# Patient Record
Sex: Male | Born: 1990 | Race: Black or African American | Hispanic: No | Marital: Married | Smoking: Current every day smoker
Health system: Southern US, Community
[De-identification: ages and names within clinical notes are randomized; demographics above are authoritative.]

## PROBLEM LIST (undated history)

## (undated) HISTORY — PX: TONSILLECTOMY: SUR1361

---

## 2011-08-02 ENCOUNTER — Encounter (HOSPITAL_COMMUNITY): Payer: Self-pay | Admitting: Certified Registered Nurse Anesthetist

## 2011-08-02 ENCOUNTER — Emergency Department (HOSPITAL_COMMUNITY): Payer: Self-pay

## 2011-08-02 ENCOUNTER — Encounter (HOSPITAL_COMMUNITY)
Admission: EM | Disposition: A | Discharge status: Home / Self Care | Payer: Self-pay | Source: Home / Self Care | Attending: Orthopedic Surgery

## 2011-08-02 ENCOUNTER — Emergency Department (HOSPITAL_COMMUNITY): Payer: Self-pay | Admitting: Certified Registered Nurse Anesthetist

## 2011-08-02 ENCOUNTER — Encounter (HOSPITAL_COMMUNITY): Payer: Self-pay | Admitting: *Deleted

## 2011-08-02 ENCOUNTER — Inpatient Hospital Stay (HOSPITAL_COMMUNITY)
Admission: EM | Admit: 2011-08-02 | Discharge: 2011-08-05 | DRG: 857 | Disposition: A | Discharge status: Home / Self Care | Payer: Self-pay | Attending: Orthopedic Surgery | Admitting: Orthopedic Surgery

## 2011-08-02 DIAGNOSIS — T8140XA Infection following a procedure, unspecified, initial encounter: Principal | ICD-10-CM | POA: Diagnosis present

## 2011-08-02 DIAGNOSIS — F17200 Nicotine dependence, unspecified, uncomplicated: Secondary | ICD-10-CM | POA: Diagnosis present

## 2011-08-02 DIAGNOSIS — Y849 Medical procedure, unspecified as the cause of abnormal reaction of the patient, or of later complication, without mention of misadventure at the time of the procedure: Secondary | ICD-10-CM | POA: Diagnosis present

## 2011-08-02 DIAGNOSIS — L02519 Cutaneous abscess of unspecified hand: Secondary | ICD-10-CM | POA: Diagnosis present

## 2011-08-02 DIAGNOSIS — L089 Local infection of the skin and subcutaneous tissue, unspecified: Secondary | ICD-10-CM

## 2011-08-02 DIAGNOSIS — W268XXA Contact with other sharp object(s), not elsewhere classified, initial encounter: Secondary | ICD-10-CM | POA: Diagnosis present

## 2011-08-02 DIAGNOSIS — L03019 Cellulitis of unspecified finger: Secondary | ICD-10-CM | POA: Diagnosis present

## 2011-08-02 HISTORY — PX: I & D EXTREMITY: SHX5045

## 2011-08-02 LAB — CBC
Hemoglobin: 14.7 g/dL (ref 13.0–17.0)
MCH: 29.6 pg (ref 26.0–34.0)
MCHC: 34.8 g/dL (ref 30.0–36.0)
Platelets: 177 10*3/uL (ref 150–400)

## 2011-08-02 LAB — POCT I-STAT, CHEM 8
Creatinine, Ser: 1 mg/dL (ref 0.50–1.35)
HCT: 47 % (ref 39.0–52.0)
Hemoglobin: 16 g/dL (ref 13.0–17.0)
Potassium: 4.1 mEq/L (ref 3.5–5.1)
Sodium: 138 mEq/L (ref 135–145)
TCO2: 30 mmol/L (ref 0–100)

## 2011-08-02 SURGERY — IRRIGATION AND DEBRIDEMENT EXTREMITY
Anesthesia: General | Site: Hand | Laterality: Left | Wound class: Contaminated

## 2011-08-02 MED ORDER — ALPRAZOLAM 0.5 MG PO TABS
0.5000 mg | ORAL_TABLET | Freq: Four times a day (QID) | ORAL | Status: DC | PRN
  Administered 2011-08-03: 0.5 mg via ORAL
  Filled 2011-08-02: qty 1

## 2011-08-02 MED ORDER — HYDROMORPHONE HCL PF 1 MG/ML IJ SOLN
0.5000 mg | INTRAMUSCULAR | Status: DC | PRN
  Administered 2011-08-02 – 2011-08-04 (×9): 1 mg via INTRAVENOUS
  Filled 2011-08-02 (×8): qty 1

## 2011-08-02 MED ORDER — LACTATED RINGERS IV SOLN
INTRAVENOUS | Status: DC | PRN
  Administered 2011-08-02 (×2): via INTRAVENOUS

## 2011-08-02 MED ORDER — OXYCODONE HCL 5 MG PO TABS
5.0000 mg | ORAL_TABLET | ORAL | Status: DC | PRN
  Administered 2011-08-02 – 2011-08-05 (×10): 10 mg via ORAL
  Filled 2011-08-02 (×11): qty 2

## 2011-08-02 MED ORDER — MIDAZOLAM HCL 5 MG/5ML IJ SOLN
INTRAMUSCULAR | Status: DC | PRN
  Administered 2011-08-02: 2 mg via INTRAVENOUS

## 2011-08-02 MED ORDER — FENTANYL CITRATE 0.05 MG/ML IJ SOLN
INTRAMUSCULAR | Status: DC | PRN
  Administered 2011-08-02 (×2): 75 ug via INTRAVENOUS

## 2011-08-02 MED ORDER — HYDROMORPHONE HCL PF 1 MG/ML IJ SOLN
1.0000 mg | Freq: Once | INTRAMUSCULAR | Status: AC
  Administered 2011-08-02: 1 mg via INTRAVENOUS
  Filled 2011-08-02: qty 1

## 2011-08-02 MED ORDER — LIDOCAINE HCL (CARDIAC) 20 MG/ML IV SOLN
INTRAVENOUS | Status: DC | PRN
  Administered 2011-08-02: 70 mg via INTRAVENOUS

## 2011-08-02 MED ORDER — VANCOMYCIN HCL IN DEXTROSE 1-5 GM/200ML-% IV SOLN
1000.0000 mg | Freq: Once | INTRAVENOUS | Status: AC
  Administered 2011-08-02: 1000 mg via INTRAVENOUS
  Filled 2011-08-02: qty 200

## 2011-08-02 MED ORDER — ONDANSETRON HCL 4 MG PO TABS: 4.0000 mg | ORAL_TABLET | Freq: Four times a day (QID) | ORAL | Status: DC | PRN

## 2011-08-02 MED ORDER — FAMOTIDINE 20 MG PO TABS
20.0000 mg | ORAL_TABLET | Freq: Two times a day (BID) | ORAL | Status: DC | PRN
  Filled 2011-08-02: qty 1

## 2011-08-02 MED ORDER — PIPERACILLIN-TAZOBACTAM 3.375 G IVPB
3.3750 g | Freq: Three times a day (TID) | INTRAVENOUS | Status: DC
  Administered 2011-08-03 – 2011-08-05 (×8): 3.375 g via INTRAVENOUS
  Filled 2011-08-02 (×10): qty 50

## 2011-08-02 MED ORDER — PIPERACILLIN-TAZOBACTAM 3.375 G IVPB
3.3750 g | INTRAVENOUS | Status: DC
  Filled 2011-08-02: qty 50

## 2011-08-02 MED ORDER — ONDANSETRON HCL 4 MG/2ML IJ SOLN: 4.0000 mg | Freq: Four times a day (QID) | INTRAMUSCULAR | Status: DC | PRN

## 2011-08-02 MED ORDER — LACTATED RINGERS IV SOLN: INTRAVENOUS | Status: DC

## 2011-08-02 MED ORDER — SENNA 8.6 MG PO TABS
1.0000 | ORAL_TABLET | Freq: Two times a day (BID) | ORAL | Status: DC
  Administered 2011-08-03 – 2011-08-04 (×3): 8.6 mg via ORAL
  Filled 2011-08-02 (×8): qty 1

## 2011-08-02 MED ORDER — VITAMIN C 500 MG PO TABS
1000.0000 mg | ORAL_TABLET | Freq: Every day | ORAL | Status: DC
  Administered 2011-08-03 – 2011-08-05 (×3): 1000 mg via ORAL
  Filled 2011-08-02 (×3): qty 2

## 2011-08-02 MED ORDER — METHOCARBAMOL 100 MG/ML IJ SOLN
500.0000 mg | Freq: Four times a day (QID) | INTRAVENOUS | Status: DC | PRN
  Filled 2011-08-02: qty 5

## 2011-08-02 MED ORDER — METHOCARBAMOL 100 MG/ML IJ SOLN
500.0000 mg | INTRAMUSCULAR | Status: AC
  Administered 2011-08-02: 500 mg via INTRAVENOUS
  Filled 2011-08-02 (×2): qty 5

## 2011-08-02 MED ORDER — TEMAZEPAM 15 MG PO CAPS: 15.0000 mg | ORAL_CAPSULE | Freq: Every evening | ORAL | Status: DC | PRN

## 2011-08-02 MED ORDER — METHOCARBAMOL 500 MG PO TABS
500.0000 mg | ORAL_TABLET | Freq: Four times a day (QID) | ORAL | Status: DC | PRN
  Administered 2011-08-03 – 2011-08-04 (×4): 500 mg via ORAL
  Filled 2011-08-02 (×4): qty 1

## 2011-08-02 MED ORDER — HYDROMORPHONE HCL PF 1 MG/ML IJ SOLN
0.2500 mg | INTRAMUSCULAR | Status: DC | PRN
  Administered 2011-08-02 (×3): 0.5 mg via INTRAVENOUS

## 2011-08-02 MED ORDER — DIPHENHYDRAMINE HCL 25 MG PO CAPS
25.0000 mg | ORAL_CAPSULE | Freq: Four times a day (QID) | ORAL | Status: DC | PRN
  Administered 2011-08-03 – 2011-08-04 (×3): 25 mg via ORAL
  Filled 2011-08-02 (×4): qty 1

## 2011-08-02 MED ORDER — VANCOMYCIN HCL IN DEXTROSE 1-5 GM/200ML-% IV SOLN
1000.0000 mg | Freq: Three times a day (TID) | INTRAVENOUS | Status: DC
  Administered 2011-08-03 – 2011-08-05 (×7): 1000 mg via INTRAVENOUS
  Filled 2011-08-02 (×10): qty 200

## 2011-08-02 MED ORDER — VANCOMYCIN HCL IN DEXTROSE 1-5 GM/200ML-% IV SOLN: 1000.0000 mg | INTRAVENOUS | Status: DC

## 2011-08-02 MED ORDER — PROPOFOL 10 MG/ML IV BOLUS
INTRAVENOUS | Status: DC | PRN
  Administered 2011-08-02: 200 mg via INTRAVENOUS

## 2011-08-02 SURGICAL SUPPLY — 40 items
BANDAGE CONFORM 2  STR LF (GAUZE/BANDAGES/DRESSINGS) IMPLANT
BANDAGE ELASTIC 4 VELCRO ST LF (GAUZE/BANDAGES/DRESSINGS) ×2 IMPLANT
BANDAGE GAUZE ELAST BULKY 4 IN (GAUZE/BANDAGES/DRESSINGS) ×2 IMPLANT
CLOTH BEACON ORANGE TIMEOUT ST (SAFETY) ×2 IMPLANT
CORDS BIPOLAR (ELECTRODE) ×2 IMPLANT
CUFF TOURNIQUET SINGLE 18IN (TOURNIQUET CUFF) ×2 IMPLANT
CUFF TOURNIQUET SINGLE 24IN (TOURNIQUET CUFF) IMPLANT
CUFF TOURNIQUET SINGLE 34IN LL (TOURNIQUET CUFF) IMPLANT
CUFF TOURNIQUET SINGLE 44IN (TOURNIQUET CUFF) IMPLANT
DRSG ADAPTIC 3X8 NADH LF (GAUZE/BANDAGES/DRESSINGS) ×2 IMPLANT
ELECT REM PT RETURN 9FT ADLT (ELECTROSURGICAL) ×2
ELECTRODE REM PT RTRN 9FT ADLT (ELECTROSURGICAL) ×1 IMPLANT
GAUZE XEROFORM 1X8 LF (GAUZE/BANDAGES/DRESSINGS) ×2 IMPLANT
GLOVE BIOGEL M STRL SZ7.5 (GLOVE) ×2 IMPLANT
GLOVE SS BIOGEL STRL SZ 8 (GLOVE) ×1 IMPLANT
GLOVE SUPERSENSE BIOGEL SZ 8 (GLOVE) ×1
GOWN PREVENTION PLUS XLARGE (GOWN DISPOSABLE) ×2 IMPLANT
GOWN STRL NON-REIN LRG LVL3 (GOWN DISPOSABLE) ×6 IMPLANT
GOWN STRL REIN XL XLG (GOWN DISPOSABLE) ×4 IMPLANT
HANDPIECE INTERPULSE COAX TIP (DISPOSABLE)
KIT BASIN OR (CUSTOM PROCEDURE TRAY) ×2 IMPLANT
KIT ROOM TURNOVER OR (KITS) ×2 IMPLANT
MANIFOLD NEPTUNE II (INSTRUMENTS) ×2 IMPLANT
NEEDLE HYPO 25GX1X1/2 BEV (NEEDLE) IMPLANT
NS IRRIG 1000ML POUR BTL (IV SOLUTION) ×2 IMPLANT
PACK ORTHO EXTREMITY (CUSTOM PROCEDURE TRAY) ×2 IMPLANT
PAD ARMBOARD 7.5X6 YLW CONV (MISCELLANEOUS) ×4 IMPLANT
PAD CAST 4YDX4 CTTN HI CHSV (CAST SUPPLIES) ×1 IMPLANT
PADDING CAST COTTON 4X4 STRL (CAST SUPPLIES) ×1
SET HNDPC FAN SPRY TIP SCT (DISPOSABLE) IMPLANT
SPONGE GAUZE 4X4 12PLY (GAUZE/BANDAGES/DRESSINGS) ×2 IMPLANT
SPONGE LAP 18X18 X RAY DECT (DISPOSABLE) ×2 IMPLANT
SPONGE LAP 4X18 X RAY DECT (DISPOSABLE) ×2 IMPLANT
SYR CONTROL 10ML LL (SYRINGE) IMPLANT
TOWEL OR 17X24 6PK STRL BLUE (TOWEL DISPOSABLE) ×2 IMPLANT
TOWEL OR 17X26 10 PK STRL BLUE (TOWEL DISPOSABLE) ×2 IMPLANT
TUBE ANAEROBIC SPECIMEN COL (MISCELLANEOUS) ×2 IMPLANT
TUBE CONNECTING 12X1/4 (SUCTIONS) ×2 IMPLANT
WATER STERILE IRR 1000ML POUR (IV SOLUTION) ×2 IMPLANT
YANKAUER SUCT BULB TIP NO VENT (SUCTIONS) ×2 IMPLANT

## 2011-08-02 NOTE — Progress Notes (Signed)
Report to Silvestre Gunner  RN as primary caregiver

## 2011-08-02 NOTE — ED Provider Notes (Signed)
Medical screening examination/treatment/procedure(s) were conducted as a shared visit with non-physician practitioner(s) and myself.  I personally evaluated the patient during the encounter  Angel [name]. Angel Payor, MD 08/02/11 [phone]

## 2011-08-02 NOTE — Consult Note (Signed)
  See dictated History and Physical #[id number]  Angel [name]. Angel [name] M.D. Orthopedic/Hand surgery  August 02 2011

## 2011-08-02 NOTE — Anesthesia Preprocedure Evaluation (Addendum)
Anesthesia Evaluation  Patient identified by MRN, date of birth, ID band Patient awake    Reviewed: Allergy & Precautions, H&P , NPO status , Patient's Chart, lab work & pertinent test results  History of Anesthesia Complications Negative for: history of anesthetic complications  Airway Mallampati: I TM Distance: >3 FB Neck ROM: Full    Dental  (+) Teeth Intact and Dental Advisory Given,    Pulmonary Current Smoker,  1/4 ppd smoker breath sounds clear to auscultation        Cardiovascular negative cardio ROS  Rhythm:Regular Rate:Normal     Neuro/Psych negative neurological ROS     GI/Hepatic negative GI ROS, Neg liver ROS,   Endo/Other  negative endocrine ROS  Renal/GU negative Renal ROS  negative genitourinary   Musculoskeletal negative musculoskeletal ROS (+)   Abdominal   Peds  Hematology negative hematology ROS (+)   Anesthesia Other Findings   Reproductive/Obstetrics                           Anesthesia Physical Anesthesia Plan  ASA: II and Emergent  Anesthesia Plan: General   Post-op Pain Management:    Induction: Intravenous  Airway Management Planned: LMA  Additional Equipment:   Intra-op Plan:   Post-operative Plan: Extubation in OR  Informed Consent: I have reviewed the patients History and Physical, chart, labs and discussed the procedure including the risks, benefits and alternatives for the proposed anesthesia with the patient or authorized representative who has indicated his/her understanding and acceptance.   Dental advisory given  Plan Discussed with:   Anesthesia Plan Comments:         Anesthesia Quick Evaluation

## 2011-08-02 NOTE — ED Notes (Signed)
States top piece of wall unit fell and split skin of left hand. Rates the pain as 10/10. Hand red, swollen, tender to touch

## 2011-08-02 NOTE — Transfer of Care (Signed)
Immediate Anesthesia Transfer of Care Note  Patient: Angel [name]  Procedure(s) Performed: Procedure(s) (LRB): IRRIGATION AND DEBRIDEMENT EXTREMITY (Left)  Patient Location: PACU  Anesthesia Type: General  Level of Consciousness: awake and sedated  Airway & Oxygen Therapy: Patient connected to nasal cannula oxygen  Post-op Assessment: Report given to PACU RN and Post -op Vital signs reviewed and stable  Post vital signs: Reviewed and stable  Complications: No apparent anesthesia complications

## 2011-08-02 NOTE — ED Provider Notes (Signed)
4:39 PM [name] to see Dr Amanda Pea (hand surgeon) for wound infection of left hand.  Pt requesting pain medication at present.  Have ordered dilaudid.  Will continue to control pain while awaiting surgery.    8:25 PM Patient transported to OR.    Angel [name], [place] 08/02/11 2025

## 2011-08-02 NOTE — Op Note (Signed)
See full dictation Dict # [id number] Oletta Cohn MD

## 2011-08-02 NOTE — ED Notes (Signed)
Angel [name] [phone] (cell) please call when ready for discharge

## 2011-08-02 NOTE — ED Notes (Signed)
Pt states he was moving and cut left hand to 4th finger, had stitches placed in [place] on Friday. Pt states he was not placed on antibiotics. Pt with swelling redness and drainage and stitch site.

## 2011-08-02 NOTE — ED Notes (Signed)
Have spoken with dr Amanda Pea about pt fever. He does not wish to give any new orders at this time.

## 2011-08-02 NOTE — Anesthesia Procedure Notes (Signed)
Procedure Name: LMA Insertion Date/Time: 08/02/2011 9:09 PM Performed by: Molli Hazard Pre-anesthesia Checklist: Patient identified, Suction available, Emergency Drugs available and Patient being monitored Patient Re-evaluated:Patient Re-evaluated prior to inductionOxygen Delivery Method: Circle system utilized and Simple face mask Intubation Type: IV induction Ventilation: Mask ventilation without difficulty LMA: LMA inserted LMA Size: 5.0 Tube type: Oral Endobronchial tube: EBT position confirmed by auscultation Number of attempts: 1 Placement Confirmation: positive ETCO2 and breath sounds checked- equal and bilateral Secured at: 24 cm Dental Injury: Teeth and Oropharynx as per pre-operative assessment

## 2011-08-02 NOTE — ED Provider Notes (Signed)
History     CSN: [id number]  Arrival date & time 08/02/11  1308   First MD Initiated Contact with Patient 08/02/11 1454      Chief Complaint  Patient presents with  . Hand Injury    (Consider location/radiation/quality/duration/timing/severity/associated sxs/prior treatment) Patient is a 21 y.o. male presenting with hand injury. The history is provided by the patient.  Hand Injury  Associated symptoms include a fever.   patient was moved from [place] and had a cut to the dorsum of his left hand at his finger. He had stitches placed in [place] on Friday. Patient states that starting Saturday night he developed redness swelling drainage since increased pain. States his been having fevers and chills. He was not started on both. He states he does not a tetanus shot. He states an x-ray was done and was negative at that time. He is otherwise healthy. He states it hurts to move the finger at all. He states he's had a little bit of cough. No nausea vomiting or diarrhea.  History reviewed. No pertinent past medical history.  Past Surgical History  Procedure Date  . Tonsillectomy     History reviewed. No pertinent family history.  History  Substance Use Topics  . Smoking status: Current Everyday Smoker -- 0.2 packs/day for 10 years    Types: Cigarettes  . Smokeless tobacco: Never Used  . Alcohol Use: No      Review of Systems  Constitutional: Positive for fever and chills.  Respiratory: Positive for cough. Negative for shortness of breath.   Cardiovascular: Negative for chest pain.  Gastrointestinal: Negative for abdominal pain.  Musculoskeletal: Positive for joint swelling. Negative for back pain.       Swelling of his left hand.  Skin: Positive for wound.  Neurological: Negative for seizures.    Allergies  Review of patient's allergies indicates no known allergies.  Home Medications   No current outpatient prescriptions on file.  BP 149/84  Pulse 96  Temp(Src)  100.2 F (37.9 C) (Oral)  Resp 16  Ht 5\' 9"  (1.753 m)  Wt 200 lb (90.719 kg)  BMI 29.53 kg/m2  SpO2 97%  Physical Exam  Nursing note and vitals reviewed. Constitutional: He is oriented to person, place, and time. He appears well-developed and well-nourished.  HENT:  Head: Normocephalic and atraumatic.  Cardiovascular: Normal rate, regular rhythm and normal heart sounds.   No murmur heard. Pulmonary/Chest: Effort normal and breath sounds normal.  Abdominal: Soft. Bowel sounds are normal. There is no tenderness.  Musculoskeletal: He exhibits edema and tenderness.       Approximately 1.5 cm laceration over dorsum of left hand at forth MCP joint. Erythema and swelling to dorsum of hand and fingers. Pain with movement of ring finger. There is evidence of previous drainage from the wound.  Neurological: He is alert and oriented to person, place, and time. No cranial nerve deficit.  Skin: Skin is warm and dry. There is erythema.  Psychiatric: He has a normal mood and affect.    ED Course  Procedures (including critical care time)  Labs Reviewed  POCT I-STAT, CHEM 8 - Abnormal; Notable for the following:    BUN 5 (*)    All other components within normal limits  CBC  ANAEROBIC CULTURE  CULTURE, ROUTINE-ABSCESS  CBC  DIFFERENTIAL  WOUND CULTURE  AEROBIC CULTURE  ANAEROBIC CULTURE  FUNGUS CULTURE W SMEAR  AFB CULTURE WITH SMEAR   Dg Hand Complete Left  08/02/2011  *RADIOLOGY  REPORT*  Clinical Data: Pain, redness, swelling, recent trauma and suturing of laceration  LEFT HAND - COMPLETE 3+ VIEW  Comparison: None  Findings: Fingers mildly flexed on all views limiting assessment. Diffuse soft tissue swelling left hand. Osseous mineralization normal. Joint spaces preserved. Fingers partially superimposed on lateral view. Tip of distal phalanx middle finger excluded on lateral view. No acute fracture, dislocation or bone destruction.  IMPRESSION: No definite acute osseous findings.   Original Report Authenticated By: Lollie Marrow, M.D.     No diagnosis found.    MDM  Patient with infection of left hand. Taken to OR by Dr. Cheree Ditto and for wash out. He was given IV antibiotics here.        Juliet Rude. Rubin Payor, MD 08/03/11 0002

## 2011-08-02 NOTE — Anesthesia Postprocedure Evaluation (Signed)
  Anesthesia Post-op Note  Patient: Angel [name]  Procedure(s) Performed: Procedure(s) (LRB): IRRIGATION AND DEBRIDEMENT EXTREMITY (Left)  Patient Location: PACU  Anesthesia Type: General  Level of Consciousness: awake  Airway and Oxygen Therapy: Patient Spontanous Breathing  Post-op Pain: mild  Post-op Assessment: Post-op Vital signs reviewed  Post-op Vital Signs: Reviewed  Complications: No apparent anesthesia complications

## 2011-08-02 NOTE — ED Notes (Signed)
States lac to left hand Friday in [place], states had rx for antibiotics but did not get it filled. Hand now swollen, drainage at site, redness. Pt reports difficulty moving last three digits.

## 2011-08-02 NOTE — H&P (Signed)
Angel [name] is an 21 y.o. male.   Chief Complaint: infected left hand HPI: See H&P [id number] dict #  History reviewed. No pertinent past medical history.  History reviewed. No pertinent past surgical history.  History reviewed. No pertinent family history. Social History:  reports that he has been smoking.  He does not have any smokeless tobacco history on file. He reports that he does not drink alcohol. His drug history not on file.  Allergies: No Known Allergies   (Not in a hospital admission)  Results for orders placed during the hospital encounter of 08/02/11 (from the past 48 hour(s))  CBC     Status: Normal   Collection Time   08/02/11  1:18 PM      Component Value Range Comment   WBC 7.1  4.0 - 10.5 (K/uL)    RBC 4.97  4.22 - 5.81 (MIL/uL)    Hemoglobin 14.7  13.0 - 17.0 (g/dL)    HCT 04.5  40.9 - 81.1 (%)    MCV 85.1  78.0 - 100.0 (fL)    MCH 29.6  26.0 - 34.0 (pg)    MCHC 34.8  30.0 - 36.0 (g/dL)    RDW 91.4  78.2 - 95.6 (%)    Platelets 177  150 - 400 (K/uL)   POCT I-STAT, CHEM 8     Status: Abnormal   Collection Time   08/02/11  1:32 PM      Component Value Range Comment   Sodium 138  135 - 145 (mEq/L)    Potassium 4.1  3.5 - 5.1 (mEq/L)    Chloride 97  96 - 112 (mEq/L)    BUN 5 (*) 6 - 23 (mg/dL)    Creatinine, Ser 2.13  0.50 - 1.35 (mg/dL)    Glucose, Bld 93  70 - 99 (mg/dL)    Calcium, Ion 0.86  1.12 - 1.32 (mmol/L)    TCO2 30  0 - 100 (mmol/L)    Hemoglobin 16.0  13.0 - 17.0 (g/dL)    HCT 57.8  46.9 - 62.9 (%)    Dg Hand Complete Left  08/02/2011  *RADIOLOGY REPORT*  Clinical Data: Pain, redness, swelling, recent trauma and suturing of laceration  LEFT HAND - COMPLETE 3+ VIEW  Comparison: None  Findings: Fingers mildly flexed on all views limiting assessment. Diffuse soft tissue swelling left hand. Osseous mineralization normal. Joint spaces preserved. Fingers partially superimposed on lateral view. Tip of distal phalanx middle finger excluded on lateral view.  No acute fracture, dislocation or bone destruction.  IMPRESSION: No definite acute osseous findings.  Original Report Authenticated By: Lollie Marrow, M.D.    Review of Systems  HENT: Negative.   Eyes: Negative.   Cardiovascular: Negative.   Gastrointestinal: Negative.   Genitourinary: Negative.   Neurological: Negative.   Psychiatric/Behavioral: Negative.     Blood pressure 131/83, pulse 97, temperature 100.1 F (37.8 C), temperature source Oral, resp. rate 24, SpO2 96.00%. Physical Exam ..The patient is alert and oriented in no acute distress the patient complains of pain in the affected upper extremity. The patient is noted to have a normal HEENT exam. Lung fields show equal chest expansion and no shortness of breath abdomen exam is nontender without distention. Lower extremity examination does not show any fracture dislocation or blood clot symptoms. Pelvis is stable neck and back are stable and nontender  Assessment/Plan .Marland KitchenWe are planning surgery for your upper extremity. The risk and benefits of surgery include risk of bleeding infection anesthesia damage  to normal structures and failure of the surgery to accomplish its intended goals of relieving symptoms and restoring function with this in mind we'll going to proceed. I have specifically discussed with the patient the pre-and postoperative regime and the does and don'ts and risk and benefits in great detail. Risk and benefits of surgery also include risk of dystrophy chronic nerve pain failure of the healing process to go onto completion and other inherent risks of surgery The relavent the pathophysiology of the disease/injury process, as well as the alternatives for treatment and postoperative course of action has been discussed in great detail with the patient who desires to proceed.  We will do everything in our power to help you (the patient) restore function to the upper extremity. Is a pleasure to see this patient today.  See  dict # [id number]  Karen Chafe 08/02/2011, 8:46 PM

## 2011-08-02 NOTE — ED Notes (Signed)
Consulting MD at bedside

## 2011-08-02 NOTE — Op Note (Signed)
Angel [name], [name]              ACCOUNT NO.:  [id number]  MEDICAL RECORD NO.:  [id number]  LOCATION:  [facility]                         FACILITY:  [facility]  PHYSICIAN:  Dionne Ano. Bryleigh Ottaway, M.D.DATE OF BIRTH:  [birth year]  DATE OF PROCEDURE: DATE OF DISCHARGE:                              OPERATIVE REPORT   PREOPERATIVE DIAGNOSIS:  Infection, left hand with abscess formation.  POSTOPERATIVE DIAGNOSES:  Infection, left hand with abscess formation with extensor hood encroachment in open metacarpophalangeal joint, left ring finger.  SURGICAL PROCEDURE: 1. Irrigation debridement of skin and subcutaneous tissue, tendon and     muscle, left hand.  This was an excisional debridement of a deep     abscess. 2. Arthrotomy and synovectomy, left ring finger, MCP joint. 3. Extensive extensor tenolysis, tenosynovectomy, and decompression of     purulence, left hand including ring finger.  SURGEON:  Dionne Ano. Amanda Pea, MD  ASSISTANT:  Karie Chimera, PA-C  COMPLICATIONS:  None.  ANESTHESIA:  General.  TOURNIQUET TIME:  Less than 30 minutes.  INDICATIONS FOR THE PROCEDURE:  This patient is a pleasant male, presents with above-mentioned diagnosis.  I have counseled in regards to risks and benefits of surgery including risk of infection, bleeding, anesthesia, damage to normal structures, and failure of surgery to accomplish its intended goals of relieving symptoms and restoring function.  With this in mind, he desires to proceed.  All questions have been encouraged and answered preoperatively.  I should note that he came to [place] via [place] state where he was moving, sustained a laceration, went to the ER, had sutures placed Friday, now has an abscess in his hand.  He did not take antibiotics.  The patient understands this current predicament, risks and benefits, do's and don'ts.  OPERATION IN DETAIL:  The patient was seen by myself and Anesthesia, taken to the operative suite.  Pre and postop  retained, reviewed with the patient.  Consent was signed.  Time-out called.  General anesthesia was induced, and following this, he was prepped and draped in usual sterile fashion and Betadine scrub and paint about the left upper extremity.  Once this was done, bed was turned and sutures were removed. Following this, I performed I and D and cultures aerobic and anaerobic were taken.  The patient had obvious purulence, this is an infected wound.  I noted the patient did have an open MCP joint with encroachment about the radial border of the extensor hood.  Thus, this time, we opened the MCP joint up and at this juncture, performed an arthrotomy, synovectomy with removal of the capsule, so it could drain.  Following this, I then performed an extensive tenolysis and tenosynovectomy of the extensor apparatus.  The patient tolerated this well.  Following this, greater than 3 liters were placed through the wound through and through followed by placement of a drain and three loose stitches.  We closed them up very loosely to allow for the egression of fluid.  We will admit him for IV antibiotics.  We will await for final cultures and treat this very aggressively and make sure we get a good organism.  He did have preoperative antibiotics given by the ER staff (  vancomycin).  Thus, we may have some degree of a skin culture. Nevertheless, given the deep abscess in his joint, we need to treat this aggressively.  It was pleasure to see him today.  He was taken to recovery room after sterile dressings and splint were applied and there were no complicating features.  All sponge, needle and instrument counts were reported as correct.  Should any problems arise, he will notify us.  Otherwise, we look forward to seen him back in the office in the postop period after he is discharged from the hospital.  He will be spent a few days here due to the infection.     Dionne Ano. Amanda Pea,  M.D.     [facility]  D:  08/02/2011  T:  08/02/2011  Job:  [id number]

## 2011-08-02 NOTE — Progress Notes (Signed)
ANTIBIOTIC CONSULT NOTE - INITIAL  Pharmacy Consult for Vanco/Zosyn Indication: Post-op orthopedic hand surgery  No Known Allergies  Patient Measurements:   Adjusted Body Weight:    Vital Signs: Temp: 99.3 F (37.4 C) (06/04 2200) Temp src: Oral (06/04 1810) BP: 131/83 mmHg (06/04 1810) Pulse Rate: 97  (06/04 1810) Intake/Output from previous day:   Intake/Output from this shift: Total I/O In: 1000 [I.V.:1000] Out: -   Labs:  Basename 08/02/11 1332 08/02/11 1318  WBC -- 7.1  HGB 16.0 14.7  PLT -- 177  LABCREA -- --  CREATININE 1.00 --   CrCl is unknown because there is no height on file for the current visit. No results found for this basename: VANCOTROUGH:2,VANCOPEAK:2,VANCORANDOM:2,GENTTROUGH:2,GENTPEAK:2,GENTRANDOM:2,TOBRATROUGH:2,TOBRAPEAK:2,TOBRARND:2,AMIKACINPEAK:2,AMIKACINTROU:2,AMIKACIN:2, in the last 72 hours   Microbiology: No results found for this or any previous visit (from the past 720 hour(s)).  Medical History: History reviewed. No pertinent past medical history.  Medications:  Prescriptions prior to admission  Medication Sig Dispense Refill  . acetaminophen (TYLENOL) 500 MG tablet Take 500 mg by mouth every 6 (six) hours as needed. For pain       Assessment: Trauma to left hand. No pertinent PMH. Labs ok. Start post-op Vanco/Zosyn for normal renal function with no drug allergies.  Goal of Therapy:  Vancomycin trough level 10-15 mcg/ml  Plan:  Zosyn 3.375g IV q8h. Vanco 1g IV q8h. Trough after 3-5 doses as steady state if abx continued.  Merilynn Finland, [employer] 08/02/2011,10:19 PM

## 2011-08-02 NOTE — Preoperative (Signed)
Beta Blockers   Reason not to administer Beta Blockers:Not Applicable 

## 2011-08-03 ENCOUNTER — Encounter (HOSPITAL_COMMUNITY): Payer: Self-pay | Admitting: Orthopedic Surgery

## 2011-08-03 LAB — CBC
MCH: 28.9 pg (ref 26.0–34.0)
MCV: 86 fL (ref 78.0–100.0)
Platelets: 173 10*3/uL (ref 150–400)
RDW: 12.1 % (ref 11.5–15.5)

## 2011-08-03 LAB — DIFFERENTIAL
Eosinophils Relative: 0 % (ref 0–5)
Lymphs Abs: 1.5 10*3/uL (ref 0.7–4.0)
Monocytes Absolute: 1.1 10*3/uL — ABNORMAL HIGH (ref 0.1–1.0)

## 2011-08-03 NOTE — Progress Notes (Signed)
Physical Therapy Note  Order received. Per OT patient ambulating safely and I'ly down stairs and throughout the hospital. No skilled PT services needed at this time. Please re-consult if skilled PT services needed in future. PT signing off at this time.  Lewis Shock, PT, DPT Pager #: [phone] Office #: [phone]

## 2011-08-03 NOTE — H&P (Signed)
[name], [name]              ACCOUNT NO.:  [id number]  MEDICAL RECORD NO.:  [id number]  LOCATION:  [facility]                         FACILITY:  [facility]  PHYSICIAN:  Dionne Ano. Serenah Mill, M.D.DATE OF BIRTH:  1990-11-29  DATE OF ADMISSION:  08/02/2011 DATE OF DISCHARGE:                             HISTORY & PHYSICAL   Angel [name] is a 21 year old male, Friday he was seen in [place]. He was moving to [place].  He cut himself with what he thinks was the nail, went to the emergency room, had sutures placed, but did not get antibiotics because he did not have any identification.  He states his ID was lost.  He does not have a ID that he was able to get the antibiotics, and subsequent to this, he has developed a raging invasion in the left hand.  He has dorsal sutures over the ring finger left hand. He has extreme pain, loss of motion and what appears to be cellulitis and likely abscess.  PAST MEDICAL HISTORY:  None.  PAST SURGICAL HISTORY:  Tonsillectomy.  MEDICINES:  None.  ALLERGIES:  None.  SOCIAL HISTORY:  He does not drink.  He does not use illicit drugs.  He occasionally smokes cigarettes and he lives with his cousin and family.  PHYSICAL EXAMINATION:  GENERAL:  Pleasant male, in no acute distress. VITAL SIGNS:  Stable. EXTREMITIES:  He has full sensation to the elbow, wrist and forearm laceration with sutures and has surrounding erythema over the ring finger, left hand.  He has erythema to the wrist and to the PIP joint. He has an obvious infection.  No evidence of dystrophy.  No evidence of compartment syndrome.  Right upper extremity is neurovascularly intact. HEENT:  Within limits. CHEST:  Clear.  Lower extremity examination is benign. ABDOMEN:  Nontender.  I have reviewed this with him at length.  He denies other complaints.  We reviewed all issues including x-rays, current state of affairs, etc.  IMPRESSION:  Infection of the left hand after lacerations sutured  in [place] stage Friday which is now roughly 5 days ago.  PLAN:  I have discussed him his findings.  Present time, I would recommend irrigation and debridement and repair, reconstruction as necessary.  I specifically want to look at his tendon.  I have discussed with him that this will require hospital stay to get his infection under control.  The emergency room staff has already given him vancomycin.  I have discussed with him all issues including the fact that he has significant signs of infection in my opinion and I am quite concerned.  We will plan for ID emergently and repair reconstruction as necessary.  He understands the risks and benefits and desires to proceed.  All questions have been encouraged and answered preoperatively.     Dionne Ano. Amanda Pea, M.D.     [facility]  D:  08/02/2011  T:  08/02/2011  Job:  [id number]

## 2011-08-03 NOTE — Progress Notes (Signed)
Subjective: 1 Day Post-Op Procedure(s) (LRB): IRRIGATION AND DEBRIDEMENT EXTREMITY (Left) Patient reports pain as moderate.  Patient is doing relatively well. He does have pain. He states his better than yesterday. He notes no numbness or tingling. His ring fingers the area of pain. He slept very well.  Objective: Vital signs in last 24 hours: Temp:  [98.1 F (36.7 C)-101 F (38.3 C)] 98.1 F (36.7 C) (06/05 0559) Pulse Rate:  [81-97] 82  (06/05 0559) Resp:  [13-28] 16  (06/05 0559) BP: (126-156)/(68-91) 129/68 mmHg (06/05 0559) SpO2:  [95 %-100 %] 95 % (06/05 0559) FiO2 (%):  [28 %] 28 % (06/04 2345) Weight:  [90.719 kg (200 lb)] 90.719 kg (200 lb) (06/04 2200)  Intake/Output from previous day: 06/04 0701 - 06/05 0700 In: 3260 [P.O.:1680; I.V.:1380; IV Piggyback:200] Out: -  Intake/Output this shift:     Basename 08/02/11 2343 08/02/11 1332 08/02/11 1318  HGB 13.8 16.0 14.7    Basename 08/02/11 2343 08/02/11 1332 08/02/11 1318  WBC 6.8 -- 7.1  RBC 4.78 -- 4.97  HCT 41.1 47.0 --  PLT 173 -- 177    Basename 08/02/11 1332  NA 138  K 4.1  CL 97  CO2 --  BUN 5*  CREATININE 1.00  GLUCOSE 93  CALCIUM --   No results found for this basename: LABPT:2,INR:2 in the last 72 hours  Neurologically intact ABD soft Neurovascular intact Sensation intact distally Intact pulses distally Compartment soft .Marland KitchenThe patient is alert and oriented in no acute distress the patient complains of pain in the affected upper extremity. The patient is noted to have a normal HEENT exam. Lung fields show equal chest expansion and no shortness of breath abdomen exam is nontender without distention. Lower extremity examination does not show any fracture dislocation or blood clot symptoms. Pelvis is stable neck and back are stable and nontender  Assessment/Plan: 1 Day Post-Op Procedure(s) (LRB): IRRIGATION AND DEBRIDEMENT EXTREMITY (Left) Advance diet I explained patient post I&D protocol  I  discussed with patient we are going to plan for IV antibiotics, general postop observation, dressing change tomorrow and close monitoring of his condition. Given the fact that he had infection into his joint, cultures need to be finalized and much improvement before dated DC and he understands this.  Overall he is stable we discussed all issues in great detail. We'll continue all antibiotic and postop wound care measures  Jean Skow III,Yeraldin Litzenberger M 08/03/2011, 7:57 AM

## 2011-08-03 NOTE — Progress Notes (Signed)
OT Note Order received. Chart reviewed. Spoke with pt and his mom who were already aware how to perform digit exercises and instructed to keep LUE elevated on 3 pillows by MD. Also instructed to use ice for edema. Pt has been independently ambulating around the hospital without difficulty. Per pt and mom, pt has been having no difficulty with ADLs. Family will be able to assist pt prn at home. Pt presents with no OT needs at this time. Will sign off.   Garrel Ridgel, OTR/L  Pager [phone] 08/03/2011

## 2011-08-04 MED ORDER — WHITE PETROLATUM GEL
Status: AC
  Filled 2011-08-04: qty 5

## 2011-08-04 NOTE — Progress Notes (Signed)
Patient ID: Angel [name], male   DOB: August 14, 1990, 21 y.o.   MRN: [id number] .Angel [name] Subjective:  Patient is awake this a.m., feeling better overall. His pain is improved and he feels the swelling is diminishing. He denies fever or chills, sob or cp. Patient is tolerating PO's and voiding without difficulties. Objective: Vital signs in last 24 hours: Temp:  [98.6 F (37 C)-99.2 F (37.3 C)] 98.7 F (37.1 C) (06/06 0614) Pulse Rate:  [82-96] 92  (06/06 0614) Resp:  [18-20] 20  (06/06 0614) BP: (121-132)/(62-82) 130/62 mmHg (06/06 0614) SpO2:  [96 %-100 %] 97 % (06/06 0614)  Intake/Output from previous day: 06/05 0701 - 06/06 0700 In: 240 [P.O.:240] Out: -  Intake/Output this shift: Total I/O In: 240 [P.O.:240] Out: -    Basename 08/02/11 2343 08/02/11 1332 08/02/11 1318  HGB 13.8 16.0 14.7    Basename 08/02/11 2343 08/02/11 1332 08/02/11 1318  WBC 6.8 -- 7.1  RBC 4.78 -- 4.97  HCT 41.1 47.0 --  PLT 173 -- 177    Basename 08/02/11 1332  NA 138  K 4.1  CL 97  CO2 --  BUN 5*  CREATININE 1.00  GLUCOSE 93  CALCIUM --   No results found for this basename: LABPT:2,INR:2 in the last 72 hours Gram stain shows no growth to date Cultures pending  .Angel KitchenPatient presents for evaluation and treatment of the of their upper extremity predicament. The patient denies neck back chest or of abdominal pain. The patient notes that they have no lower extremity problems. The patient from primarily complains of the upper extremity pain noted. Lue; dressings are removed, the patient has improvement in the soft tissue, still with mild erythema, overall cellulitis is improving, drains are pulled x 2 Digits have mild edema, tender with attempts at ROM, however, with coercion he is able to make near a full fist, no gross purulence is expressed from the wound Assessment/Plan: S/P left hand I&D, history of dorsal laceration seen and treated in [place] with development of cellulitis and abscess I have  discussed with the patient proceeding with repeat Irrigation and excisional debridement today at bedside. After obtaining verbal consent the patient was prepped and sharp excisional debridement was performed to the skin and subcutaneous tissue removing necrotic and pre-necrotic tissue. Copious irrigation, 1 liter of saline was then implemented. He tolerated the procedure well and without complications. The wounds were then packed and soft dressings were applied. We will have him work on diligent ROM and continue IV ABX awaiting final cultures.   Angel [name] L 08/04/2011, 1:38 PM
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
## 2011-08-04 NOTE — Progress Notes (Signed)
ANTIBIOTIC CONSULT NOTE - FOLLOW UP  Pharmacy Consult for Vanco/Zosyn  Indication: Post-op orthopedic hand surgery  Assessment: 21 yom with trauma to left hand possible cut with a nail and was not able to receive post-op abx he has since developed raging cellulitis with noted with abscess formation. Patient started on empiric broad antibiotics, currently on day #2 of vancomycin and zosyn. I&D performed 08/02/11. Plan is for patient to be discharged on home IV antibiotics  Micro data: 6/4 - hand abscess - ngtd  Antibiotics: Vancomycin 6/4>> Zosyn 6/4>>  Goal of Therapy:  Vancomycin trough level 10-15 mcg/ml  Plan:  Will plan on checking vancomycin trough this afternoon. No Dose adjustments are needed for the zosyn.  Vital Signs: Temp: 98.7 F (37.1 C) (06/06 0614) BP: 130/62 mmHg (06/06 0614) Pulse Rate: 92  (06/06 0614)  Labs:  Basename 08/02/11 2343 08/02/11 1332 08/02/11 1318  WBC 6.8 -- 7.1  HGB 13.8 16.0 14.7  PLT 173 -- 177  LABCREA -- -- --  CREATININE -- 1.00 --   Estimated Creatinine Clearance: 127.9 ml/min (by C-G formula based on Cr of 1). No results found for this basename: VANCOTROUGH:2,VANCOPEAK:2,VANCORANDOM:2,GENTTROUGH:2,GENTPEAK:2,GENTRANDOM:2,TOBRATROUGH:2,TOBRAPEAK:2,TOBRARND:2,AMIKACINPEAK:2,AMIKACINTROU:2,AMIKACIN:2, in the last 72 hours   Microbiology: Recent Results (from the past 720 hour(s))  ANAEROBIC CULTURE     Status: Normal (Preliminary result)   Collection Time   08/02/11 10:44 PM      Component Value Range Status Comment   Specimen Description ABSCESS LEFT HAND   Final    Special Requests NONE   Final    Gram Stain     Final    Value: ABUNDANT WBC PRESENT,BOTH PMN AND MONONUCLEAR     NO SQUAMOUS EPITHELIAL CELLS SEEN     NO ORGANISMS SEEN   Culture     Final    Value: NO ANAEROBES ISOLATED; CULTURE IN PROGRESS FOR 5 DAYS   Report Status PENDING   Incomplete   CULTURE, ROUTINE-ABSCESS     Status: Normal (Preliminary result)   Collection Time   08/02/11 10:44 PM      Component Value Range Status Comment   Specimen Description ABSCESS LEFT HAND   Final    Special Requests NONE   Final    Gram Stain     Final    Value: ABUNDANT WBC PRESENT,BOTH PMN AND MONONUCLEAR     NO SQUAMOUS EPITHELIAL CELLS SEEN     NO ORGANISMS SEEN   Culture NO GROWTH 1 DAY   Final    Report Status PENDING   Incomplete     Anti-infectives     Start     Dose/Rate Route Frequency Ordered Stop   08/03/11 0700   vancomycin (VANCOCIN) IVPB 1000 mg/200 mL premix        1,000 mg 200 mL/hr over 60 Minutes Intravenous Every 8 hours 08/02/11 2227     08/03/11 0600  piperacillin-tazobactam (ZOSYN) IVPB 3.375 g       3.375 g 12.5 mL/hr over 240 Minutes Intravenous 3 times per day 08/02/11 2227     08/02/11 2230   vancomycin (VANCOCIN) IVPB 1000 mg/200 mL premix  Status:  Discontinued        1,000 mg 200 mL/hr over 60 Minutes Intravenous To Post Anesthesia Care Unit 08/02/11 2227 08/02/11 2306   08/02/11 2230   piperacillin-tazobactam (ZOSYN) IVPB 3.375 g  Status:  Discontinued        3.375 g 12.5 mL/hr over 240 Minutes Intravenous To Post Anesthesia Care Unit 08/02/11 2227 08/02/11 2306  08/02/11 1515   vancomycin (VANCOCIN) IVPB 1000 mg/200 mL premix        1,000 mg 200 mL/hr over 60 Minutes Intravenous  Once 08/02/11 1500 08/02/11 1709          Angel [name], Angel [name] 08/04/2011,10:35 AM

## 2011-08-05 MED ORDER — SULFAMETHOXAZOLE-TMP DS 800-160 MG PO TABS: 1.0000 | ORAL_TABLET | Freq: Two times a day (BID) | ORAL | Status: AC

## 2011-08-05 MED ORDER — OXYCODONE HCL 5 MG PO CAPS: 5.0000 mg | ORAL_CAPSULE | ORAL | Status: AC | PRN

## 2011-08-05 NOTE — Progress Notes (Signed)
Pt D/C to home. Scripts and D/C instructions given. 

## 2011-08-05 NOTE — Discharge Instructions (Signed)
Please change her bandage daily as instructed. Please continue to move your fingers frequently.   Keep bandage clean and dry.  Call for any problems.  No smoking.  Criteria for driving a car: you should be off your pain medicine for 7-8 hours, able to drive one handed(confident), thinking clearly and feeling able in your judgement to drive. Continue elevation as it will decrease swelling.  If instructed by MD move your fingers within the confines of the bandage/splint.  Use ice if instructed by your MD. Call immediately for any sudden loss of feeling in your hand/arm or change in functional abilities of the extremity.   We recommend that you to take vitamin C 1000 mg a day to promote healing we also recommend that if you require her pain medicine that he take a stool softener to prevent constipation as most pain medicines will have constipation side effects. We recommend either Peri-Colace or Senokot and recommend that you also consider adding MiraLAX to prevent the constipation affects from pain medicine if you are required to use them. These medicines are over the counter and maybe purchased at a local pharmacy.

## 2011-08-05 NOTE — Discharge Summary (Signed)
Physician Discharge Summary  Patient ID: Angel [name] MRN: [id number] DOB/AGE: 05/28/1990 21 y.o.  Admit date: 08/02/2011 Discharge date: 08/05/2011  Admission Diagnoses: Infection left hand with abscess  Discharge Diagnoses: Same Active Problems:  * No active hospital problems. *    Discharged Condition: good  Hospital Course: Patient was admitted for irrigation and debridement of an abscess in his left hand. He was admitted did well and on today's date with cultures not final threatened the sign out against medical devices he was ready to go home. He states that he would just get to another hospital if we did not discharge him. He had AMA paperwork ready and does I formally discharged him. I discussed with the patient his findings. I did discuss with him plans followup and prescribed antibiotics as I feel this will be in his best interest despite a probable need to stay further. The patient is understanding of my recommendations and the plans. Consults:    Treatments: surgery:  Irrigation and debridement in the operative theater as well as bedside irrigation and debridement  Discharge Exam: Blood pressure 124/66, pulse 77, temperature 97.7 F (36.5 C), temperature source Oral, resp. rate 16, height 5\' 9"  (1.753 m), weight 90.719 kg (200 lb), SpO2 100.00%. General appearance: alert and appears stated age..The patient is alert and oriented in no acute distress the patient complains of pain in the affected upper extremity. The patient is noted to have a normal HEENT exam. Lung fields show equal chest expansion and no shortness of breath abdomen exam is nontender without distention. Lower extremity examination does not show any fracture dislocation or blood clot symptoms. Pelvis is stable neck and back are stable and nontender His hand is much improved his cultures are not final we will plan for DC his request  today. He is instructed on followup care plan.  Disposition: Final discharge  disposition not confirmed   Medication List  As of 08/05/2011  5:17 PM   ASK your doctor about these medications         acetaminophen 500 MG tablet   Commonly known as: TYLENOL   Take 500 mg by mouth every 6 (six) hours as needed. For pain           Follow-up Information    Follow up with Karen Chafe, MD. (Please call the office Monday so that we may see you in 6 days the office number is [phone])    Contact information:   [address] [place] [address] [phone]          Signed: Karen Chafe 08/05/2011, 5:17 PM

## 2011-08-06 LAB — CULTURE, ROUTINE-ABSCESS

## 2011-08-07 LAB — ANAEROBIC CULTURE

## 2015-05-06 ENCOUNTER — Encounter (HOSPITAL_COMMUNITY): Payer: Self-pay | Admitting: *Deleted

## 2015-05-06 ENCOUNTER — Emergency Department (HOSPITAL_COMMUNITY)
Admission: EM | Admit: 2015-05-06 | Discharge: 2015-05-06 | Disposition: A | Discharge status: Home / Self Care | Payer: Self-pay | Attending: Emergency Medicine | Admitting: Emergency Medicine

## 2015-05-06 DIAGNOSIS — R1030 Lower abdominal pain, unspecified: Secondary | ICD-10-CM | POA: Insufficient documentation

## 2015-05-06 DIAGNOSIS — F17210 Nicotine dependence, cigarettes, uncomplicated: Secondary | ICD-10-CM | POA: Insufficient documentation

## 2015-05-06 DIAGNOSIS — R112 Nausea with vomiting, unspecified: Secondary | ICD-10-CM | POA: Insufficient documentation

## 2015-05-06 LAB — COMPREHENSIVE METABOLIC PANEL
ALBUMIN: 4.1 g/dL (ref 3.5–5.0)
ALK PHOS: 48 U/L (ref 38–126)
ALT: 24 U/L (ref 17–63)
AST: 24 U/L (ref 15–41)
Anion gap: 12 (ref 5–15)
BILIRUBIN TOTAL: 0.8 mg/dL (ref 0.3–1.2)
BUN: 9 mg/dL (ref 6–20)
CO2: 24 mmol/L (ref 22–32)
Calcium: 9.6 mg/dL (ref 8.9–10.3)
Chloride: 101 mmol/L (ref 101–111)
Creatinine, Ser: 1.15 mg/dL (ref 0.61–1.24)
GFR calc Af Amer: >60 mL/min (ref >60)
GFR calc non Af Amer: >60 mL/min (ref >60)
GLUCOSE: 123 mg/dL — AB (ref 65–99)
POTASSIUM: 4.1 mmol/L (ref 3.5–5.1)
Sodium: 137 mmol/L (ref 135–145)
TOTAL PROTEIN: 7.5 g/dL (ref 6.5–8.1)

## 2015-05-06 LAB — LIPASE, BLOOD: Lipase: 21 U/L (ref 11–51)

## 2015-05-06 LAB — CBC
HEMATOCRIT: 47.9 % (ref 39.0–52.0)
Hemoglobin: 16.4 g/dL (ref 13.0–17.0)
MCH: 29.3 pg (ref 26.0–34.0)
MCHC: 34.2 g/dL (ref 30.0–36.0)
MCV: 85.7 fL (ref 78.0–100.0)
Platelets: 181 10*3/uL (ref 150–400)
RBC: 5.59 MIL/uL (ref 4.22–5.81)
RDW: 12.8 % (ref 11.5–15.5)
WBC: 9.4 10*3/uL (ref 4.0–10.5)

## 2015-05-06 MED ORDER — IBUPROFEN 400 MG PO TABS
ORAL_TABLET | ORAL | Status: AC
  Filled 2015-05-06: qty 1

## 2015-05-06 MED ORDER — ONDANSETRON 4 MG PO TBDP
4.0000 mg | ORAL_TABLET | Freq: Once | ORAL | Status: AC | PRN
  Administered 2015-05-06: 4 mg via ORAL

## 2015-05-06 MED ORDER — IBUPROFEN 400 MG PO TABS
400.0000 mg | ORAL_TABLET | Freq: Once | ORAL | Status: AC
  Administered 2015-05-06: 400 mg via ORAL

## 2015-05-06 MED ORDER — ONDANSETRON 4 MG PO TBDP
ORAL_TABLET | ORAL | Status: AC
  Filled 2015-05-06: qty 1

## 2015-05-06 NOTE — ED Notes (Signed)
Called for vitals no response

## 2015-05-06 NOTE — ED Notes (Signed)
Pt reports N/V/D and lower abdominal pain that started this morning.

## 2015-08-15 ENCOUNTER — Emergency Department (HOSPITAL_COMMUNITY)
Admission: EM | Admit: 2015-08-15 | Discharge: 2015-08-16 | Disposition: A | Discharge status: Home / Self Care | Payer: No Typology Code available for payment source | Attending: Emergency Medicine | Admitting: Emergency Medicine

## 2015-08-15 ENCOUNTER — Emergency Department (HOSPITAL_COMMUNITY): Payer: No Typology Code available for payment source

## 2015-08-15 ENCOUNTER — Encounter (HOSPITAL_COMMUNITY): Payer: Self-pay

## 2015-08-15 DIAGNOSIS — S4991XA Unspecified injury of right shoulder and upper arm, initial encounter: Secondary | ICD-10-CM | POA: Diagnosis present

## 2015-08-15 DIAGNOSIS — F17210 Nicotine dependence, cigarettes, uncomplicated: Secondary | ICD-10-CM | POA: Insufficient documentation

## 2015-08-15 DIAGNOSIS — S0081XA Abrasion of other part of head, initial encounter: Secondary | ICD-10-CM | POA: Insufficient documentation

## 2015-08-15 DIAGNOSIS — Y999 Unspecified external cause status: Secondary | ICD-10-CM | POA: Diagnosis not present

## 2015-08-15 DIAGNOSIS — Y92410 Unspecified street and highway as the place of occurrence of the external cause: Secondary | ICD-10-CM | POA: Diagnosis not present

## 2015-08-15 DIAGNOSIS — K449 Diaphragmatic hernia without obstruction or gangrene: Secondary | ICD-10-CM | POA: Diagnosis not present

## 2015-08-15 DIAGNOSIS — S42114A Nondisplaced fracture of body of scapula, right shoulder, initial encounter for closed fracture: Secondary | ICD-10-CM | POA: Diagnosis not present

## 2015-08-15 DIAGNOSIS — Y939 Activity, unspecified: Secondary | ICD-10-CM | POA: Insufficient documentation

## 2015-08-15 DIAGNOSIS — S42101A Fracture of unspecified part of scapula, right shoulder, initial encounter for closed fracture: Secondary | ICD-10-CM

## 2015-08-15 DIAGNOSIS — R51 Headache: Secondary | ICD-10-CM | POA: Insufficient documentation

## 2015-08-15 NOTE — ED Notes (Signed)
Patient transported to CT 
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
## 2015-08-16 ENCOUNTER — Emergency Department (HOSPITAL_COMMUNITY): Payer: No Typology Code available for payment source

## 2015-08-16 ENCOUNTER — Encounter (HOSPITAL_COMMUNITY): Payer: Self-pay | Admitting: Radiology

## 2015-08-16 LAB — COMPREHENSIVE METABOLIC PANEL
ALBUMIN: 4.3 g/dL (ref 3.5–5.0)
ALK PHOS: 49 U/L (ref 38–126)
ALT: 38 U/L (ref 17–63)
AST: 39 U/L (ref 15–41)
Anion gap: 9 (ref 5–15)
BILIRUBIN TOTAL: 0.6 mg/dL (ref 0.3–1.2)
BUN: 8 mg/dL (ref 6–20)
CALCIUM: 9.7 mg/dL (ref 8.9–10.3)
CO2: 25 mmol/L (ref 22–32)
Chloride: 105 mmol/L (ref 101–111)
Creatinine, Ser: 1.36 mg/dL — ABNORMAL HIGH (ref 0.61–1.24)
GFR calc Af Amer: >60 mL/min (ref >60)
GFR calc non Af Amer: >60 mL/min (ref >60)
GLUCOSE: 112 mg/dL — AB (ref 65–99)
Potassium: 4.3 mmol/L (ref 3.5–5.1)
Sodium: 139 mmol/L (ref 135–145)
TOTAL PROTEIN: 7.2 g/dL (ref 6.5–8.1)

## 2015-08-16 LAB — CBC
HCT: 48.8 % (ref 39.0–52.0)
Hemoglobin: 16.3 g/dL (ref 13.0–17.0)
MCH: 28.5 pg (ref 26.0–34.0)
MCHC: 33.4 g/dL (ref 30.0–36.0)
MCV: 85.3 fL (ref 78.0–100.0)
Platelets: 194 10*3/uL (ref 150–400)
RBC: 5.72 MIL/uL (ref 4.22–5.81)
RDW: 12.7 % (ref 11.5–15.5)
WBC: 11.8 10*3/uL — ABNORMAL HIGH (ref 4.0–10.5)

## 2015-08-16 LAB — I-STAT CHEM 8, ED
BUN: 10 mg/dL (ref 6–20)
CALCIUM ION: 1.14 mmol/L (ref 1.12–1.23)
CHLORIDE: 103 mmol/L (ref 101–111)
Creatinine, Ser: 1.5 mg/dL — ABNORMAL HIGH (ref 0.61–1.24)
Glucose, Bld: 110 mg/dL — ABNORMAL HIGH (ref 65–99)
HCT: 52 % (ref 39.0–52.0)
Hemoglobin: 17.7 g/dL — ABNORMAL HIGH (ref 13.0–17.0)
Potassium: 4.3 mmol/L (ref 3.5–5.1)
SODIUM: 143 mmol/L (ref 135–145)
TCO2: 28 mmol/L (ref 0–100)

## 2015-08-16 LAB — URINALYSIS, ROUTINE W REFLEX MICROSCOPIC
BILIRUBIN URINE: NEGATIVE
Glucose, UA: NEGATIVE mg/dL
HGB URINE DIPSTICK: NEGATIVE
Ketones, ur: NEGATIVE mg/dL
Leukocytes, UA: NEGATIVE
Nitrite: NEGATIVE
PROTEIN: NEGATIVE mg/dL
Specific Gravity, Urine: 1.025 (ref 1.005–1.030)
pH: 6.5 (ref 5.0–8.0)

## 2015-08-16 LAB — PROTIME-INR
INR: 1.07 (ref 0.00–1.49)
Prothrombin Time: 14.1 seconds (ref 11.6–15.2)

## 2015-08-16 LAB — I-STAT CG4 LACTIC ACID, ED: LACTIC ACID, VENOUS: 1.64 mmol/L (ref 0.5–2.0)

## 2015-08-16 LAB — ETHANOL: ALCOHOL ETHYL (B): 127 mg/dL — AB (ref <5)

## 2015-08-16 MED ORDER — IBUPROFEN 800 MG PO TABS: 800.0000 mg | ORAL_TABLET | Freq: Three times a day (TID) | ORAL | Status: DC

## 2015-08-16 MED ORDER — IOPAMIDOL (ISOVUE-300) INJECTION 61%
INTRAVENOUS | Status: AC
  Administered 2015-08-16: 100 mL
  Filled 2015-08-16: qty 100

## 2015-08-16 MED ORDER — ACETAMINOPHEN 500 MG PO TABS
1000.0000 mg | ORAL_TABLET | Freq: Once | ORAL | Status: AC
  Administered 2015-08-16: 1000 mg via ORAL
  Filled 2015-08-16: qty 2

## 2015-08-16 MED ORDER — TETANUS-DIPHTH-ACELL PERTUSSIS 5-2.5-18.5 LF-MCG/0.5 IM SUSP
0.5000 mL | Freq: Once | INTRAMUSCULAR | Status: AC
  Administered 2015-08-16: 0.5 mL via INTRAMUSCULAR
  Filled 2015-08-16: qty 0.5

## 2015-08-16 MED ORDER — SODIUM CHLORIDE 0.9 % IV BOLUS (SEPSIS)
1000.0000 mL | Freq: Once | INTRAVENOUS | Status: AC
  Administered 2015-08-16: 1000 mL via INTRAVENOUS

## 2015-08-16 MED ORDER — CYCLOBENZAPRINE HCL 10 MG PO TABS: 10.0000 mg | ORAL_TABLET | Freq: Two times a day (BID) | ORAL | Status: DC | PRN

## 2015-08-16 NOTE — ED Notes (Signed)
Taken to CT at this time. 
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
## 2015-08-17 LAB — SAMPLE TO BLOOD BANK

## 2019-03-20 ENCOUNTER — Emergency Department (HOSPITAL_COMMUNITY)
Admission: EM | Admit: 2019-03-20 | Discharge: 2019-03-20 | Disposition: A | Discharge status: Home / Self Care | Payer: Self-pay | Attending: Emergency Medicine | Admitting: Emergency Medicine

## 2019-03-20 ENCOUNTER — Emergency Department (HOSPITAL_COMMUNITY): Payer: Self-pay

## 2019-03-20 ENCOUNTER — Encounter (HOSPITAL_COMMUNITY): Payer: Self-pay

## 2019-03-20 DIAGNOSIS — W503XXA Accidental bite by another person, initial encounter: Secondary | ICD-10-CM

## 2019-03-20 DIAGNOSIS — S61214A Laceration without foreign body of right ring finger without damage to nail, initial encounter: Secondary | ICD-10-CM | POA: Insufficient documentation

## 2019-03-20 DIAGNOSIS — F17210 Nicotine dependence, cigarettes, uncomplicated: Secondary | ICD-10-CM | POA: Insufficient documentation

## 2019-03-20 DIAGNOSIS — Y929 Unspecified place or not applicable: Secondary | ICD-10-CM | POA: Insufficient documentation

## 2019-03-20 DIAGNOSIS — Z23 Encounter for immunization: Secondary | ICD-10-CM | POA: Insufficient documentation

## 2019-03-20 DIAGNOSIS — Y999 Unspecified external cause status: Secondary | ICD-10-CM | POA: Insufficient documentation

## 2019-03-20 DIAGNOSIS — Y9389 Activity, other specified: Secondary | ICD-10-CM | POA: Insufficient documentation

## 2019-03-20 DIAGNOSIS — S61411A Laceration without foreign body of right hand, initial encounter: Secondary | ICD-10-CM

## 2019-03-20 MED ORDER — TETANUS-DIPHTH-ACELL PERTUSSIS 5-2.5-18.5 LF-MCG/0.5 IM SUSP
0.5000 mL | Freq: Once | INTRAMUSCULAR | Status: AC
  Administered 2019-03-20: 05:00:00 0.5 mL via INTRAMUSCULAR
  Filled 2019-03-20: qty 0.5

## 2019-03-20 MED ORDER — LIDOCAINE-EPINEPHRINE (PF) 2 %-1:200000 IJ SOLN
10.0000 mL | Freq: Once | INTRAMUSCULAR | Status: AC
  Administered 2019-03-20: 10 mL
  Filled 2019-03-20: qty 20

## 2019-03-20 MED ORDER — AMOXICILLIN-POT CLAVULANATE 875-125 MG PO TABS
1.0000 | ORAL_TABLET | Freq: Once | ORAL | Status: AC
  Administered 2019-03-20: 1 via ORAL
  Filled 2019-03-20: qty 1

## 2019-03-20 MED ORDER — AMOXICILLIN-POT CLAVULANATE 875-125 MG PO TABS: 1.0000 | ORAL_TABLET | Freq: Two times a day (BID) | ORAL | 0 refills | Status: DC
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
## 2019-03-20 NOTE — ED Notes (Signed)
Discharge instructions discussed with pt. Pt verbalized understanding with no questions at this time. Pt to follow up with ortho.  
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
## 2019-03-20 NOTE — Discharge Instructions (Signed)
Carefully monitor your cut for signs of infection. This would include redness, increased pain, fever, or discharge.  If you notice any of these, return to the ER or contact the hand doctor listed.

## 2019-05-11 ENCOUNTER — Other Ambulatory Visit: Payer: Self-pay

## 2019-05-11 ENCOUNTER — Ambulatory Visit (HOSPITAL_COMMUNITY)
Admission: EM | Admit: 2019-05-11 | Discharge: 2019-05-11 | Disposition: A | Discharge status: Home / Self Care | Payer: Self-pay
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
## 2019-07-01 ENCOUNTER — Ambulatory Visit (HOSPITAL_COMMUNITY)
Admission: EM | Admit: 2019-07-01 | Discharge: 2019-07-01 | Disposition: A | Discharge status: Home / Self Care | Payer: Self-pay | Attending: Internal Medicine | Admitting: Internal Medicine

## 2019-07-01 ENCOUNTER — Other Ambulatory Visit: Payer: Self-pay

## 2019-07-01 ENCOUNTER — Encounter (HOSPITAL_COMMUNITY): Payer: Self-pay

## 2019-07-01 DIAGNOSIS — U071 COVID-19: Secondary | ICD-10-CM | POA: Insufficient documentation

## 2019-07-01 DIAGNOSIS — Z202 Contact with and (suspected) exposure to infections with a predominantly sexual mode of transmission: Secondary | ICD-10-CM | POA: Insufficient documentation

## 2019-07-01 DIAGNOSIS — Z9189 Other specified personal risk factors, not elsewhere classified: Secondary | ICD-10-CM

## 2019-07-01 DIAGNOSIS — F17210 Nicotine dependence, cigarettes, uncomplicated: Secondary | ICD-10-CM | POA: Insufficient documentation

## 2019-07-01 MED ORDER — METRONIDAZOLE 500 MG PO TABS: 500.0000 mg | ORAL_TABLET | Freq: Two times a day (BID) | ORAL | 0 refills | Status: DC

## 2019-07-01 MED FILL — metroNIDAZOLE 500 MG TABS: 500 | 7 days supply | Qty: 14 | Fill #0
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
## 2019-07-01 NOTE — Discharge Instructions (Addendum)
No alcohol with metronidazole. No sexual intercourse for 7 days.

## 2019-07-02 LAB — CYTOLOGY, (ORAL, ANAL, URETHRAL) ANCILLARY ONLY
Chlamydia: NEGATIVE
Comment: NEGATIVE
Comment: NEGATIVE
Comment: NORMAL
Neisseria Gonorrhea: NEGATIVE
Trichomonas: NEGATIVE

## 2019-07-02 LAB — SARS CORONAVIRUS 2 (TAT 6-24 HRS): SARS Coronavirus 2: POSITIVE — AB

## 2019-07-02 NOTE — ED Provider Notes (Signed)
[facility]    CSN: [id number] Arrival date & time: 07/01/19  0907      History   Chief Complaint Chief Complaint  Patient presents with  . Cough    HPI Angel [name] is a 29 y.o. male comes to the urgent care, headache, sore throat of 4 days duration.  Patient denies any loss of taste or smell.  No nausea, vomiting or diarrhea.  No sick contacts. Patient is requesting treatment for trichomonas.  His sexual partner was diagnosed with trichomonas 3 days.  He denies any penile discharge, pain or scrotal pain.   No fever or chills.  No rash on the penis  HPI  History reviewed. No pertinent past medical history.  There are no problems to display for this patient.   Past Surgical History:  Procedure Laterality Date  . I & D EXTREMITY  08/02/2011   Procedure: IRRIGATION AND DEBRIDEMENT EXTREMITY;  Surgeon: Dominica Severin, MD;  Location: [facility] OR;  Service: Orthopedics;  Laterality: Left;  . TONSILLECTOMY         Home Medications    Prior to Admission medications   Medication Sig Start Date End Date Taking? Authorizing Provider  amoxicillin-clavulanate (AUGMENTIN) 875-125 MG tablet Take 1 tablet by mouth every 12 (twelve) hours. 03/20/19   Roxy Horseman, PA-C  cyclobenzaprine (FLEXERIL) 10 MG tablet Take 1 tablet (10 mg total) by mouth 2 (two) times daily as needed for muscle spasms. 08/16/15   Elpidio Anis, PA-C  ibuprofen (ADVIL,MOTRIN) 800 MG tablet Take 1 tablet (800 mg total) by mouth 3 (three) times daily. 08/16/15   Elpidio Anis, PA-C  metroNIDAZOLE (FLAGYL) 500 MG tablet Take 1 tablet (500 mg total) by mouth 2 (two) times daily. 07/01/19   Quientin Jent, Britta Mccreedy, MD    Family History Family History  Problem Relation Age of Onset  . Healthy Mother     Social History Social History   Tobacco Use  . Smoking status: Current Every Day Smoker    Packs/day: 0.25    Years: 10.00    Pack years: 2.50    Types: Cigarettes  . Smokeless tobacco: Never Used    Substance Use Topics  . Alcohol use: No  . Drug use: No     Allergies   Patient has no known allergies.   Review of Systems Review of Systems  Constitutional: Negative for diaphoresis and fatigue.  HENT: Positive for sore throat.   Respiratory: Positive for cough.   Gastrointestinal: Negative for abdominal pain, diarrhea, nausea and vomiting.  Genitourinary: Negative.   Skin: Negative.   Neurological: Positive for headaches.     Physical Exam Triage Vital Signs ED Triage Vitals  Enc Vitals Group     BP 07/01/19 0953 (!) 136/102     Pulse Rate 07/01/19 0953 78     Resp 07/01/19 0953 18     Temp 07/01/19 0953 98.2 F (36.8 C)     Temp Source 07/01/19 0953 Oral     SpO2 07/01/19 0953 100 %     Weight 07/01/19 1000 218 lb 6.4 oz (99.1 kg)     Height --      Head Circumference --      Peak Flow --      Pain Score 07/01/19 1038 0     Pain Loc --      Pain Edu? --      Excl. in GC? --    No data found.  Updated Vital Signs BP (!) 136/102 (BP  Location: Right Arm)   Pulse 78   Temp 98.2 F (36.8 C) (Oral)   Resp 18   Wt 99.1 kg   SpO2 100%   BMI 33.21 kg/m   Visual Acuity Right Eye Distance:   Left Eye Distance:   Bilateral Distance:    Right Eye Near:   Left Eye Near:    Bilateral Near:     Physical Exam Vitals and nursing note reviewed.  Constitutional:      General: He is not in acute distress.    Appearance: He is not ill-appearing.  Cardiovascular:     Rate and Rhythm: Normal rate and regular rhythm.     Pulses: Normal pulses.     Heart sounds: Normal heart sounds.  Pulmonary:     Effort: Pulmonary effort is normal.     Breath sounds: Normal breath sounds. No wheezing or rhonchi.  Abdominal:     General: Bowel sounds are normal.     Palpations: Abdomen is soft.  Genitourinary:    Penis: Normal.   Musculoskeletal:        General: Normal range of motion.  Skin:    Capillary Refill: Capillary refill takes less than 2 seconds.   Neurological:     Mental Status: He is alert.      UC Treatments / Results  Labs (all labs ordered are listed, but only abnormal results are displayed) Labs Reviewed  SARS CORONAVIRUS 2 (TAT 6-24 HRS) - Abnormal; Notable for the following components:      Result Value   SARS Coronavirus 2 POSITIVE (*)    All other components within normal limits  CYTOLOGY, (ORAL, ANAL, URETHRAL) ANCILLARY ONLY    EKG   Radiology No results found.  Procedures Procedures (including critical care time)  Medications Ordered in UC Medications - No data to display  Initial Impression / Assessment and Plan / UC Course  I have reviewed the triage vital signs and the nursing notes.  Pertinent labs & imaging results that were available during my care of the patient were reviewed by me and considered in my medical decision making (see chart for details).     1.  Exposure to trichomonas: Cytology for GC/chlamydia/trichomonas metronidazole 500 mg twice daily for 7 days Patient is advised to abstain from sexual intercourse for the STD results are available Safe sex practices Patient's partner needs to be treated as well  2.  Close exposure to COVID-19 with symptoms: COVID-19 PCR swab has been sent Patient is advised to quarantine Tylenol/Motrin for pain Patient is advised to hydrate Return precautions given. Final Clinical Impressions(s) / UC Diagnoses   Final diagnoses:  Exposure to trichomonas  At increased risk of exposure to COVID-19 virus     Discharge Instructions     No alcohol with metronidazole. No sexual intercourse for 7 days.   ED Prescriptions    Medication Sig Dispense Auth. Provider   metroNIDAZOLE (FLAGYL) 500 MG tablet Take 1 tablet (500 mg total) by mouth 2 (two) times daily. 14 tablet Katera Rybka, Myrene Galas, MD     PDMP not reviewed this encounter.   Chase Picket, MD 07/02/19 2111

## 2019-08-27 ENCOUNTER — Other Ambulatory Visit: Payer: Self-pay

## 2019-08-27 ENCOUNTER — Encounter (HOSPITAL_COMMUNITY): Payer: Self-pay | Admitting: Emergency Medicine

## 2019-08-27 ENCOUNTER — Ambulatory Visit (HOSPITAL_COMMUNITY)
Admission: EM | Admit: 2019-08-27 | Discharge: 2019-08-27 | Disposition: A | Discharge status: Home / Self Care | Payer: HRSA Program | Attending: Physician Assistant | Admitting: Physician Assistant

## 2019-08-27 DIAGNOSIS — J069 Acute upper respiratory infection, unspecified: Secondary | ICD-10-CM | POA: Insufficient documentation

## 2019-08-27 DIAGNOSIS — Z20822 Contact with and (suspected) exposure to covid-19: Secondary | ICD-10-CM | POA: Insufficient documentation

## 2019-08-27 LAB — SARS CORONAVIRUS 2 (TAT 6-24 HRS): SARS Coronavirus 2: NEGATIVE

## 2019-08-27 MED ORDER — FLUTICASONE PROPIONATE 50 MCG/ACT NA SUSP: 1.0000 | Freq: Every day | NASAL | 0 refills | Status: DC

## 2019-08-27 MED FILL — FLUTICASONE PROP 50 MCG SPR: 50 | 60 days supply | Qty: 16 | Fill #0

## 2019-08-27 NOTE — ED Triage Notes (Signed)
Patient presents to urgent care today with symptoms of cough, chest congestion, headaceh and sore throat. Symptoms began on 3 days ago. They have tried excedrin for the headache with some relief of symptoms.

## 2019-08-27 NOTE — Discharge Instructions (Signed)
Continue excedrin Start flonase daily Clear nasal secretions  Return if not improving over 7 days.  If your Covid-19 test is positive, you will receive a phone call from [facility] regarding your results. Negative test results are not called. Both positive and negative results area always visible on MyChart. If you do not have a MyChart account, sign up instructions are in your discharge papers.   Persons who are directed to care for themselves at home may discontinue isolation under the following conditions:   At least 10 days have passed since symptom onset and  At least 24 hours have passed without running a fever (this means without the use of fever-reducing medications) and  Other symptoms have improved.  Persons infected with COVID-19 who never develop symptoms may discontinue isolation and other precautions 10 days after the date of their first positive COVID-19 test.

## 2019-08-27 NOTE — ED Provider Notes (Signed)
[facility]    CSN: [id number] Arrival date & time: 08/27/19  9735      History   Chief Complaint Chief Complaint  Patient presents with  . URI    HPI Angel [name] is a 29 y.o. male.   Patient reports for evaluation of cold-like symptoms.  He reports 3 days ago he developed sore throat, headache and a dry cough.  He reports headache is primarily sinus pressure.  Reports sore throat has been mild.  Cough has been nonproductive.  Denies shortness of breath and chest discomfort.  Denies fever and chills.  Primary concern is nasal congestion.  He reports his job is requiring him to be Covid tested despite having Covid last month in early May.  He reports complete resolution of those symptoms following his Covid diagnosis and current symptoms.  Excedrin has helped his headache.     History reviewed. No pertinent past medical history.  There are no problems to display for this patient.   Past Surgical History:  Procedure Laterality Date  . I & D EXTREMITY  08/02/2011   Procedure: IRRIGATION AND DEBRIDEMENT EXTREMITY;  Surgeon: Dominica Severin, MD;  Location: [facility];  Service: Orthopedics;  Laterality: Left;  . TONSILLECTOMY         Home Medications    Prior to Admission medications   Medication Sig Start Date End Date Taking? Authorizing Provider  amoxicillin-clavulanate (AUGMENTIN) 875-125 MG tablet Take 1 tablet by mouth every 12 (twelve) hours. 03/20/19   Roxy Horseman, PA-C  cyclobenzaprine (FLEXERIL) 10 MG tablet Take 1 tablet (10 mg total) by mouth 2 (two) times daily as needed for muscle spasms. 08/16/15   Elpidio Anis, PA-C  fluticasone (FLONASE) 50 MCG/ACT nasal spray Place 1 spray into both nostrils daily. 08/27/19   Mystie Ormand, Veryl Speak, PA-C  ibuprofen (ADVIL,MOTRIN) 800 MG tablet Take 1 tablet (800 mg total) by mouth 3 (three) times daily. 08/16/15   Elpidio Anis, PA-C  metroNIDAZOLE (FLAGYL) 500 MG tablet Take 1 tablet (500 mg total) by mouth 2 (two) times  daily. 07/01/19   Lamptey, Britta Mccreedy, MD    Family History Family History  Problem Relation Age of Onset  . Healthy Mother     Social History Social History   Tobacco Use  . Smoking status: Current Every Day Smoker    Packs/day: 0.25    Years: 10.00    Pack years: 2.50    Types: Cigarettes  . Smokeless tobacco: Never Used  Substance Use Topics  . Alcohol use: No  . Drug use: No     Allergies   Patient has no known allergies.   Review of Systems Review of Systems   Physical Exam Triage Vital Signs ED Triage Vitals  Enc Vitals Group     BP 08/27/19 0917 (!) 141/83     Pulse Rate 08/27/19 0917 93     Resp 08/27/19 0917 16     Temp 08/27/19 0917 98.6 F (37 C)     Temp Source 08/27/19 0917 Oral     SpO2 08/27/19 0917 97 %     Weight --      Height --      Head Circumference --      Peak Flow --      Pain Score 08/27/19 0915 6     Pain Loc --      Pain Edu? --      Excl. in GC? --    No data found.  Updated Vital  Signs BP (!) 141/83 (BP Location: Left Arm)   Pulse 93   Temp 98.6 F (37 C) (Oral)   Resp 16   SpO2 97%   Visual Acuity Right Eye Distance:   Left Eye Distance:   Bilateral Distance:    Right Eye Near:   Left Eye Near:    Bilateral Near:     Physical Exam Vitals and nursing note reviewed.  Constitutional:      General: He is not in acute distress.    Appearance: He is well-developed. He is not ill-appearing.  HENT:     Head: Normocephalic and atraumatic.     Nose: Congestion and rhinorrhea present.     Mouth/Throat:     Mouth: Mucous membranes are moist.     Comments: Postnasal drip present Eyes:     Extraocular Movements: Extraocular movements intact.     Conjunctiva/sclera: Conjunctivae normal.     Pupils: Pupils are equal, round, and reactive to light.  Cardiovascular:     Rate and Rhythm: Normal rate and regular rhythm.     Heart sounds: No murmur heard.   Pulmonary:     Effort: Pulmonary effort is normal. No  respiratory distress.     Breath sounds: Normal breath sounds. No wheezing, rhonchi or rales.  Abdominal:     Palpations: Abdomen is soft.     Tenderness: There is no abdominal tenderness.  Musculoskeletal:     Cervical back: Neck supple.  Skin:    General: Skin is warm and dry.  Neurological:     Mental Status: He is alert.      UC Treatments / Results  Labs (all labs ordered are listed, but only abnormal results are displayed) Labs Reviewed  SARS CORONAVIRUS 2 (TAT 6-24 HRS)    EKG   Radiology No results found.  Procedures Procedures (including critical care time)  Medications Ordered in UC Medications - No data to display  Initial Impression / Assessment and Plan / UC Course  I have reviewed the triage vital signs and the nursing notes.  Pertinent labs & imaging results that were available during my care of the patient were reviewed by me and considered in my medical decision making (see chart for details).     #Viral URI Patient is a 29 year old presenting with viral URI.  Patient's work is requiring Covid testing, so we will oblige despite being within 60 days of previous positive.  Discussed that he may test positive despite this may not be Covid reinfection.  We will treat him symptomatically today, given response to Excedrin, will recommend continue.  Add Flonase.  Return and follow-up precautions discussed.  Patient verbalized understanding plan of care Final Clinical Impressions(s) / UC Diagnoses   Final diagnoses:  Viral upper respiratory tract infection  Encounter for laboratory testing for COVID-19 virus     Discharge Instructions     Continue excedrin Start flonase daily Clear nasal secretions  Return if not improving over 7 days.  If your Covid-19 test is positive, you will receive a phone call from [facility] regarding your results. Negative test results are not called. Both positive and negative results area always visible on MyChart. If  you do not have a MyChart account, sign up instructions are in your discharge papers.   Persons who are directed to care for themselves at home may discontinue isolation under the following conditions:  . At least 10 days have passed since symptom onset and . At least 24 hours have passed  without running a fever (this means without the use of fever-reducing medications) and . Other symptoms have improved.  Persons infected with COVID-19 who never develop symptoms may discontinue isolation and other precautions 10 days after the date of their first positive COVID-19 test.      ED Prescriptions    Medication Sig Dispense Auth. Provider   fluticasone (FLONASE) 50 MCG/ACT nasal spray Place 1 spray into both nostrils daily. 11.1 mL Pope Brunty, Veryl Speak, PA-C     PDMP not reviewed this encounter.   Hermelinda Medicus, PA-C 08/27/19 1019

## 2020-04-04 ENCOUNTER — Other Ambulatory Visit: Payer: Self-pay

## 2020-04-04 ENCOUNTER — Ambulatory Visit (HOSPITAL_COMMUNITY)
Admission: EM | Admit: 2020-04-04 | Discharge: 2020-04-04 | Disposition: A | Discharge status: Home / Self Care | Payer: Self-pay

## 2020-04-04 NOTE — ED Triage Notes (Signed)
Pt called 4 times by 2 different Nurses with no answer. PT DC .

## 2020-06-21 ENCOUNTER — Emergency Department (HOSPITAL_COMMUNITY)
Admission: EM | Admit: 2020-06-21 | Discharge: 2020-06-21 | Disposition: A | Discharge status: Home / Self Care | Payer: Self-pay | Attending: Emergency Medicine | Admitting: Emergency Medicine

## 2020-06-21 ENCOUNTER — Other Ambulatory Visit: Payer: Self-pay

## 2020-06-21 DIAGNOSIS — K047 Periapical abscess without sinus: Secondary | ICD-10-CM | POA: Insufficient documentation

## 2020-06-21 DIAGNOSIS — F17210 Nicotine dependence, cigarettes, uncomplicated: Secondary | ICD-10-CM | POA: Insufficient documentation

## 2020-06-21 MED ORDER — AMOXICILLIN-POT CLAVULANATE 875-125 MG PO TABS: 1.0000 | ORAL_TABLET | Freq: Two times a day (BID) | ORAL | 0 refills | Status: AC

## 2020-06-21 MED ORDER — ACETAMINOPHEN 500 MG PO TABS
1000.0000 mg | ORAL_TABLET | Freq: Once | ORAL | Status: AC
  Administered 2020-06-21: 1000 mg via ORAL
  Filled 2020-06-21: qty 2

## 2020-06-21 NOTE — ED Provider Notes (Addendum)
[facility] EMERGENCY DEPARTMENT Provider Note   CSN: [id number] Arrival date & time: 06/21/20  0831     History   Chief Complaint No chief complaint on file.   HPI Angel [name] is a 30 y.o. male.  Patient presents to the emergency department with a dental complaint. Symptoms began 2 weeks ago but worsened over past 2 days. The patient has tried to alleviate pain with tylenol.  Pain rated as severe, characterized as throbbing in nature and located R lower gumline. Patient denies fever, night sweats, chills, difficulty swallowing or opening mouth, SOB, nuchal rigidity or decreased ROM of neck.  Patient does not have a dentist and requests a resource guide at discharge.      HPI  No past medical history on file.  There are no problems to display for this patient.   Past Surgical History:  Procedure Laterality Date  . I & D EXTREMITY  08/02/2011   Procedure: IRRIGATION AND DEBRIDEMENT EXTREMITY;  Surgeon: Dominica Severin, MD;  Location: [facility];  Service: Orthopedics;  Laterality: Left;  . TONSILLECTOMY          Home Medications    Prior to Admission medications   Medication Sig Start Date End Date Taking? Authorizing Provider  amoxicillin-clavulanate (AUGMENTIN) 875-125 MG tablet Take 1 tablet by mouth every 12 (twelve) hours for 7 days. 06/21/20 06/28/20  Gailen Shelter, PA  cyclobenzaprine (FLEXERIL) 10 MG tablet Take 1 tablet (10 mg total) by mouth 2 (two) times daily as needed for muscle spasms. 08/16/15   Elpidio Anis, PA-C  fluticasone (FLONASE) 50 MCG/ACT nasal spray Place 1 spray into both nostrils daily. 08/27/19   Darr, Gerilyn Pilgrim, PA-C  ibuprofen (ADVIL,MOTRIN) 800 MG tablet Take 1 tablet (800 mg total) by mouth 3 (three) times daily. 08/16/15   Elpidio Anis, PA-C    Family History Family History  Problem Relation Age of Onset  . Healthy Mother     Social History Social History   Tobacco Use  . Smoking status: Current Every Day Smoker     Packs/day: 0.25    Years: 10.00    Pack years: 2.50    Types: Cigarettes  . Smokeless tobacco: Never Used  Substance Use Topics  . Alcohol use: No  . Drug use: No     Allergies   Patient has no known allergies.   Review of Systems Denies fevers, chills, difficulty swallowing or eating, changes in voice, pain under tongue, nausea, vomiting, lightheadedness or dizziness. No trismus  +dental pain  Physical Exam Updated Vital Signs BP (!) 143/104   Pulse 93   Temp 97.6 F (36.4 C) (Oral)   Resp 17   Ht 5\' 8"  (1.727 m)   Wt 104.3 kg   SpO2 99%   BMI 34.97 kg/m   Physical Exam Physical Exam  Constitutional: Pt appears well-developed and well-nourished.  HENT:  Head: Normocephalic.  Right Ear: Tympanic membrane, external ear and ear canal normal.  Left Ear: Tympanic membrane, external ear and ear canal normal.  Nose: Nose normal. Right sinus exhibits no maxillary sinus tenderness and no frontal sinus tenderness. Left sinus exhibits no maxillary sinus tenderness and no frontal sinus tenderness.  Mouth/Throat: Uvula is midline, oropharynx is clear and moist and mucous membranes are normal. No oral lesions. No uvula swelling or lacerations. No oropharyngeal exudate, posterior oropharyngeal edema, posterior oropharyngeal erythema or tonsillar abscesses.  Dentition is not overly decayed. Some decay noted.  No gingival swelling, fluctuance or induration No gross  abscess  No sublingual edema, tenderness to palpation, or sign of Ludwig's angina, or deep space infection Pain at right lower premolar  Eyes: Conjunctivae are normal. Pupils are equal, round, and reactive to light. Right eye exhibits no discharge. Left eye exhibits no discharge.  Neck: Normal range of motion. Neck supple.  No stridor Handling secretions without difficulty No nuchal rigidity No cervical lymphadenopathy Cardiovascular: Normal rate, regular rhythm and normal heart sounds.   Pulmonary/Chest: Effort  normal. No respiratory distress.  Equal chest rise  Abdominal: Soft. Bowel sounds are normal. Pt exhibits no distension. There is no tenderness.  Lymphadenopathy: Pt has no cervical adenopathy.  Neurological: Pt is alert and oriented x 4  Skin: Skin is warm and dry.  Psychiatric: Pt has a normal mood and affect.  Nursing note and vitals reviewed.   ED Treatments / Results  Labs (all labs ordered are listed, but only abnormal results are displayed) Labs Reviewed - No data to display  EKG    Radiology No results found.  Procedures Procedures (including critical care time)  Medications Ordered in ED Medications - No data to display   Initial Impression / Assessment and Plan / ED Course  I have reviewed the triage vital signs and the nursing notes.  Pertinent labs & imaging results that were available during my care of the patient were reviewed by me and considered in my medical decision making (see chart for details).        Patient with dentalgia.  No abscess requiring immediate incision and drainage.  Exam not concerning for Ludwig's angina or pharyngeal abscess.  Will treat with augmentin. Pt instructed to follow-up with dentist.  Discussed return precautions. Pt safe for discharge.   Final Clinical Impressions(s) / ED Diagnoses   Final diagnoses:  Dental infection    ED Discharge Orders         Ordered    amoxicillin-clavulanate (AUGMENTIN) 875-125 MG tablet  Every 12 hours        06/21/20 0905            Gailen Shelter, PA 06/21/20 0907    Gailen Shelter, PA 06/21/20 1245    Linwood Dibbles, MD 06/23/20 [phone]

## 2020-06-21 NOTE — Discharge Instructions (Signed)
Please take antibiotics as prescribed please follow-up with a dentist.  Please use Tylenol or ibuprofen for pain.  You may use 600 mg ibuprofen every 6 hours or 1000 mg of Tylenol every 6 hours.  You may choose to alternate between the 2.  This would be most effective.  Not to exceed 4 g of Tylenol within 24 hours.  Not to exceed 3200 mg ibuprofen 24 hours.  You may also use Orajel which can be bought over-the-counter.

## 2020-06-21 NOTE — ED Triage Notes (Signed)
Pt come POV for c/c of tooth ache. Per pt tooth ache started 2 weeks ago but the pain become unbearable yesterday. Pt states the tooth is on right bottom side of mouth. He is unable to do anything on that side of his mouth.

## 2020-06-22 ENCOUNTER — Other Ambulatory Visit: Payer: Self-pay

## 2020-06-22 ENCOUNTER — Encounter (HOSPITAL_COMMUNITY): Payer: Self-pay

## 2020-06-22 ENCOUNTER — Emergency Department (HOSPITAL_COMMUNITY): Payer: Self-pay

## 2020-06-22 ENCOUNTER — Emergency Department (HOSPITAL_COMMUNITY)
Admission: EM | Admit: 2020-06-22 | Discharge: 2020-06-22 | Disposition: A | Discharge status: Home / Self Care | Payer: Self-pay | Attending: Emergency Medicine | Admitting: Emergency Medicine

## 2020-06-22 DIAGNOSIS — R079 Chest pain, unspecified: Secondary | ICD-10-CM

## 2020-06-22 DIAGNOSIS — I10 Essential (primary) hypertension: Secondary | ICD-10-CM | POA: Insufficient documentation

## 2020-06-22 DIAGNOSIS — Z79899 Other long term (current) drug therapy: Secondary | ICD-10-CM | POA: Insufficient documentation

## 2020-06-22 DIAGNOSIS — F17210 Nicotine dependence, cigarettes, uncomplicated: Secondary | ICD-10-CM | POA: Insufficient documentation

## 2020-06-22 DIAGNOSIS — K047 Periapical abscess without sinus: Secondary | ICD-10-CM | POA: Insufficient documentation

## 2020-06-22 DIAGNOSIS — K0889 Other specified disorders of teeth and supporting structures: Secondary | ICD-10-CM

## 2020-06-22 LAB — CBC WITH DIFFERENTIAL/PLATELET
Abs Immature Granulocytes: 0.03 10*3/uL (ref 0.00–0.07)
Basophils Absolute: 0 10*3/uL (ref 0.0–0.1)
Basophils Relative: 0 %
Eosinophils Absolute: 0 10*3/uL (ref 0.0–0.5)
Eosinophils Relative: 0 %
HCT: 45.5 % (ref 39.0–52.0)
Hemoglobin: 14.6 g/dL (ref 13.0–17.0)
Immature Granulocytes: 0 %
Lymphocytes Relative: 19 %
Lymphs Abs: 1.8 10*3/uL (ref 0.7–4.0)
MCH: 27.4 pg (ref 26.0–34.0)
MCHC: 32.1 g/dL (ref 30.0–36.0)
MCV: 85.4 fL (ref 80.0–100.0)
Monocytes Absolute: 1 10*3/uL (ref 0.1–1.0)
Monocytes Relative: 10 %
Neutro Abs: 6.8 10*3/uL (ref 1.7–7.7)
Neutrophils Relative %: 71 %
Platelets: 342 10*3/uL (ref 150–400)
RBC: 5.33 MIL/uL (ref 4.22–5.81)
RDW: 12.6 % (ref 11.5–15.5)
WBC: 9.6 10*3/uL (ref 4.0–10.5)
nRBC: 0 % (ref 0.0–0.2)

## 2020-06-22 LAB — BASIC METABOLIC PANEL
Anion gap: 8 (ref 5–15)
BUN: 9 mg/dL (ref 6–20)
CO2: 27 mmol/L (ref 22–32)
Calcium: 10 mg/dL (ref 8.9–10.3)
Chloride: 103 mmol/L (ref 98–111)
Creatinine, Ser: 1.27 mg/dL — ABNORMAL HIGH (ref 0.61–1.24)
GFR, Estimated: >60 mL/min (ref >60)
Glucose, Bld: 105 mg/dL — ABNORMAL HIGH (ref 70–99)
Potassium: 4.5 mmol/L (ref 3.5–5.1)
Sodium: 138 mmol/L (ref 135–145)

## 2020-06-22 LAB — TROPONIN I (HIGH SENSITIVITY): Troponin I (High Sensitivity): 3 ng/L (ref <18)

## 2020-06-22 MED ORDER — AMLODIPINE BESYLATE 5 MG PO TABS: 5.0000 mg | ORAL_TABLET | Freq: Every day | ORAL | 0 refills | Status: DC

## 2020-06-22 MED ORDER — ACETAMINOPHEN 325 MG PO TABS
650.0000 mg | ORAL_TABLET | Freq: Once | ORAL | Status: AC
  Administered 2020-06-22: 650 mg via ORAL
  Filled 2020-06-22: qty 2

## 2020-06-22 MED ORDER — IBUPROFEN 400 MG PO TABS
400.0000 mg | ORAL_TABLET | Freq: Once | ORAL | Status: AC
  Administered 2020-06-22: 400 mg via ORAL
  Filled 2020-06-22: qty 1

## 2020-06-22 MED ORDER — BUPIVACAINE HCL (PF) 0.5 % IJ SOLN: 10.0000 mL | Freq: Once | INTRAMUSCULAR | Status: DC

## 2020-06-22 MED ORDER — CEPHALEXIN 500 MG PO CAPS: 500.0000 mg | ORAL_CAPSULE | Freq: Two times a day (BID) | ORAL | 0 refills | Status: DC

## 2020-06-22 NOTE — ED Triage Notes (Signed)
Emergency Medicine Provider Triage Evaluation Note  Angel [name] , a 30 y.o. male  was evaluated in triage.  Pt complains of elevated blood pressure. He was being seen at the dentist due to dental infection prior to arrival and sent to the ED due to elevated blood pressure (188/154, 161/121, 164/124). He notes central/left-sided chest pain started this AM. Chest pain intermittent in nature. He is mostly concerned about his dental pain. Admits to some dizziness. No history of HTN.  Review of Systems  Positive: Chest pain, dental pain, dizziness Negative: fever  Physical Exam  BP (!) 151/134 (BP Location: Left Arm)   Pulse 93   Temp 98.3 F (36.8 C) (Oral)   Resp 18   SpO2 97%  Gen:   Awake, no distress   HEENT:  Atraumatic  Resp:  Normal effort  Cardiac:  Normal rate  Abd:   Nondistended, nontender  MSK:   Moves extremities without difficulty  Neuro:  Speech clear   Medical Decision Making  Medically screening exam initiated at 2:20 PM.  Appropriate orders placed.  Angel [name] was informed that the remainder of the evaluation will be completed by another provider, this initial triage assessment does not replace that evaluation, and the importance of remaining in the ED until their evaluation is complete.  Clinical Impression  Chest pain and dizziness in the setting of elevated blood pressures. Labs ordered to rule out end organ damage.    Mannie Stabile, [place] 06/22/20 1424

## 2020-06-22 NOTE — ED Provider Notes (Signed)
[facility] EMERGENCY DEPARTMENT Provider Note   CSN: [id number] Arrival date & time: 06/22/20  1354     History Chief Complaint  Patient presents with  . Dental Pain  . Hypertension    Angel [name] is a 30 y.o. male.  HPI Patient is a 30 year old male with a minimal medical history present with a chief complaint of high blood pressure readings at dental clinic today patient states that while he was trying to get a right-sided tooth extracted versus evaluated for root canal, patient had elevated blood pressure readings x3 180s and 160s. Patient has no history of similar and was referred from dentistry to emergency department for further evaluation management.  Patient does not have a primary care provider.  Patient denies fevers or chills, nausea or vomiting, syncope or chest pain, difficulties with urination, headaches, dizziness, shortness of breath.  Patient has not been evaluated by a medical provider in years. Patient describes 3 to 4 days of tooth pain that has been recurrent.  No obvious swelling, patient without fevers or chills.  Per patient, dentist was planning on extensive tooth work. Patient otherwise ambulatory tolerating p.o. intake without any difficulty.  History reviewed. No pertinent past medical history.  There are no problems to display for this patient.   Past Surgical History:  Procedure Laterality Date  . I & D EXTREMITY  08/02/2011   Procedure: IRRIGATION AND DEBRIDEMENT EXTREMITY;  Surgeon: Dominica Severin, MD;  Location: [facility] OR;  Service: Orthopedics;  Laterality: Left;  . TONSILLECTOMY         Family History  Problem Relation Age of Onset  . Healthy Mother     Social History   Tobacco Use  . Smoking status: Current Every Day Smoker    Packs/day: 0.25    Years: 10.00    Pack years: 2.50    Types: Cigarettes  . Smokeless tobacco: Never Used  Substance Use Topics  . Alcohol use: No  . Drug use: No    Home  Medications Prior to Admission medications   Medication Sig Start Date End Date Taking? Authorizing Provider  amLODipine (NORVASC) 5 MG tablet Take 1 tablet (5 mg total) by mouth daily for 14 days. 06/22/20 07/06/20 Yes Melissa Pulido, Almeta Monas, MD  cephALEXin (KEFLEX) 500 MG capsule Take 1 capsule (500 mg total) by mouth 2 (two) times daily. 06/22/20  Yes Glyn Ade, MD  amoxicillin-clavulanate (AUGMENTIN) 875-125 MG tablet Take 1 tablet by mouth every 12 (twelve) hours for 7 days. 06/21/20 06/28/20  Gailen Shelter, PA  cyclobenzaprine (FLEXERIL) 10 MG tablet Take 1 tablet (10 mg total) by mouth 2 (two) times daily as needed for muscle spasms. 08/16/15   Elpidio Anis, PA-C  fluticasone (FLONASE) 50 MCG/ACT nasal spray Place 1 spray into both nostrils daily. 08/27/19   Darr, Gerilyn Pilgrim, PA-C  ibuprofen (ADVIL,MOTRIN) 800 MG tablet Take 1 tablet (800 mg total) by mouth 3 (three) times daily. 08/16/15   Elpidio Anis, PA-C    Allergies    Patient has no known allergies.  Review of Systems   Review of Systems  Constitutional: Negative for chills and fever.  HENT: Positive for dental problem. Negative for ear pain and sore throat.   Eyes: Negative for pain and visual disturbance.  Respiratory: Negative for cough and shortness of breath.   Cardiovascular: Negative for chest pain and palpitations.  Gastrointestinal: Negative for abdominal pain and vomiting.  Genitourinary: Negative for dysuria and hematuria.  Musculoskeletal: Negative for arthralgias and back pain.  Skin: Negative for color change and rash.  Neurological: Negative for seizures and syncope.  All other systems reviewed and are negative.   Physical Exam Updated Vital Signs BP (!) 144/106 (BP Location: Right Arm)   Pulse (!) 105   Temp 98.5 F (36.9 C) (Oral)   Resp 17   SpO2 100%   Physical Exam Vitals and nursing note reviewed.  Constitutional:      Appearance: He is well-developed.  HENT:     Head: Normocephalic and  atraumatic.     Nose: No congestion or rhinorrhea.     Mouth/Throat:     Mouth: Mucous membranes are moist.     Pharynx: Oropharynx is clear. No oropharyngeal exudate.     Comments: Patient with extensive dental disease.  Prior repairs appreciated.  Right upper molar with degenerating disease.  No obvious fracture, exposed pulp, or drainable abscess appreciated. Eyes:     Conjunctiva/sclera: Conjunctivae normal.     Pupils: Pupils are equal, round, and reactive to light.  Cardiovascular:     Rate and Rhythm: Normal rate and regular rhythm.     Heart sounds: No murmur heard.   Pulmonary:     Effort: Pulmonary effort is normal. No respiratory distress.     Breath sounds: Normal breath sounds.  Abdominal:     Palpations: Abdomen is soft.     Tenderness: There is no abdominal tenderness.  Musculoskeletal:        General: No swelling, tenderness, deformity or signs of injury. Normal range of motion.     Cervical back: Neck supple. No rigidity or tenderness.  Skin:    General: Skin is warm and dry.  Neurological:     General: No focal deficit present.     Mental Status: He is alert and oriented to person, place, and time. Mental status is at baseline.     Cranial Nerves: No cranial nerve deficit.     Motor: No weakness.     ED Results / Procedures / Treatments   Labs (all labs ordered are listed, but only abnormal results are displayed) Labs Reviewed  BASIC METABOLIC PANEL - Abnormal; Notable for the following components:      Result Value   Glucose, Bld 105 (*)    Creatinine, Ser 1.27 (*)    All other components within normal limits  CBC WITH DIFFERENTIAL/PLATELET  TROPONIN I (HIGH SENSITIVITY)    EKG None  Radiology DG Chest 2 View  Result Date: 06/22/2020 CLINICAL DATA:  Chest pain EXAM: CHEST - 2 VIEW COMPARISON:  08/15/2015 FINDINGS: The heart size and mediastinal contours are within normal limits. Both lungs are clear. The visualized skeletal structures are  unremarkable. IMPRESSION: No active cardiopulmonary disease. Electronically Signed   By: Marlan Palau M.D.   On: 06/22/2020 15:14    Procedures Procedures  Medications Ordered in ED Medications  bupivacaine (MARCAINE) 0.5 % injection 10 mL (10 mLs Infiltration Handoff 06/22/20 1633)  acetaminophen (TYLENOL) tablet 650 mg (650 mg Oral Given 06/22/20 1426)  ibuprofen (ADVIL) tablet 400 mg (400 mg Oral Given 06/22/20 1707)    ED Course  I have reviewed the triage vital signs and the nursing notes.  Pertinent labs & imaging results that were available during my care of the patient were reviewed by me and considered in my medical decision making (see chart for details).    MDM Rules/Calculators/A&P  Patient is a 30 year old male with a minimal medical history present with a chief complaint of elevated blood pressure readings and dental pain. On exam, patient continues to be hypertensive here in the emergency department.  Oral exam with extensive dental disease without obvious correctable etiology here in the emergency department. Discussed history of present illness and physical exam findings with patient.  Patient's history is most consistent with chronic hypertension given his lack of follow-up.  We will proceed with hypertensive emergency evaluation with screening labs including CMP, troponin, chest x-ray, EKG all of which are reassuring at this time.  Given the nature of his symptoms, will recommend patient initiate on amlodipine and follow-up with primary care provider in the outpatient setting. Concerning his dental disease, no obvious drainable abscess, discussed multiple options for pain control and ultimately decided on dental block and over-the-counter acetaminophen in addition to Keflex for dental infection without obvious abscess.   Final Clinical Impression(s) / ED Diagnoses Final diagnoses:  Pain, dental    Rx / DC Orders ED Discharge Orders          Ordered    amLODipine (NORVASC) 5 MG tablet  Daily        06/22/20 1721    cephALEXin (KEFLEX) 500 MG capsule  2 times daily        06/22/20 1721           Glyn Ade, MD 06/22/20 2145    Blane Ohara, MD 06/24/20 0006

## 2020-06-22 NOTE — ED Notes (Signed)
Patient still complaining of pain 10/10. Provider made awre.

## 2020-06-22 NOTE — ED Triage Notes (Signed)
Pt reports he is here today due to hypertension.Pt was at dentist and they sent him here due to his bp. Pt reports cp on arrival.

## 2021-04-17 ENCOUNTER — Encounter (HOSPITAL_COMMUNITY): Payer: Self-pay | Admitting: Emergency Medicine

## 2021-04-17 ENCOUNTER — Ambulatory Visit (HOSPITAL_COMMUNITY)
Admission: EM | Admit: 2021-04-17 | Discharge: 2021-04-17 | Disposition: A | Discharge status: Home / Self Care | Payer: 59 | Attending: Urgent Care | Admitting: Urgent Care

## 2021-04-17 ENCOUNTER — Other Ambulatory Visit: Payer: Self-pay

## 2021-04-17 DIAGNOSIS — K047 Periapical abscess without sinus: Secondary | ICD-10-CM

## 2021-04-17 DIAGNOSIS — R110 Nausea: Secondary | ICD-10-CM | POA: Diagnosis not present

## 2021-04-17 DIAGNOSIS — K0889 Other specified disorders of teeth and supporting structures: Secondary | ICD-10-CM

## 2021-04-17 MED ORDER — HYDROCODONE-ACETAMINOPHEN 5-325 MG PO TABS: 1.0000 | ORAL_TABLET | Freq: Four times a day (QID) | ORAL | 0 refills | Status: DC | PRN

## 2021-04-17 MED ORDER — ONDANSETRON 4 MG PO TBDP
ORAL_TABLET | ORAL | Status: AC
  Filled 2021-04-17: qty 1

## 2021-04-17 MED ORDER — KETOROLAC TROMETHAMINE 30 MG/ML IJ SOLN
INTRAMUSCULAR | Status: AC
  Filled 2021-04-17: qty 1

## 2021-04-17 MED ORDER — ONDANSETRON 4 MG PO TBDP: 4.0000 mg | ORAL_TABLET | Freq: Three times a day (TID) | ORAL | 0 refills | Status: DC | PRN

## 2021-04-17 MED ORDER — LIDOCAINE VISCOUS HCL 2 % MT SOLN: 15.0000 mL | Freq: Four times a day (QID) | OROMUCOSAL | 0 refills | Status: DC | PRN

## 2021-04-17 MED ORDER — PENICILLIN V POTASSIUM 500 MG PO TABS: 500.0000 mg | ORAL_TABLET | Freq: Three times a day (TID) | ORAL | 0 refills | Status: AC

## 2021-04-17 MED ORDER — KETOROLAC TROMETHAMINE 30 MG/ML IJ SOLN
30.0000 mg | Freq: Once | INTRAMUSCULAR | Status: AC
  Administered 2021-04-17: 30 mg via INTRAMUSCULAR

## 2021-04-17 MED ORDER — ONDANSETRON 4 MG PO TBDP
4.0000 mg | ORAL_TABLET | Freq: Once | ORAL | Status: AC
  Administered 2021-04-17: 4 mg via ORAL

## 2021-04-17 NOTE — ED Provider Notes (Signed)
[facility]    CSN: [id number] Arrival date & time: 04/17/21  1607      History   Chief Complaint Chief Complaint  Patient presents with   Dental Pain    HPI Angel [name] is a 31 y.o. male.   Pleasant 31 year old male presents today with concerns of right posterior molar pain.  He states for the past 2 days it has been uncomfortable, but this morning the pain has been unbearable.  He has been taking Excedrin without any relief.  He notes a history of poor dentition in the past.  He states it hurts to chew.  He denies any fever.  He does not have a dentist at present.  He denies any headache, lymphadenopathy, erythema of the skin, or any systemic symptoms.  He denies any swelling of his face. He states he has been unable to keep any food down over the past 24 hours due to the pain.   Dental Pain  History reviewed. No pertinent past medical history.  There are no problems to display for this patient.   Past Surgical History:  Procedure Laterality Date   I & D EXTREMITY  08/02/2011   Procedure: IRRIGATION AND DEBRIDEMENT EXTREMITY;  Surgeon: Dominica Severin, MD;  Location: [facility] OR;  Service: Orthopedics;  Laterality: Left;   TONSILLECTOMY         Home Medications    Prior to Admission medications   Medication Sig Start Date End Date Taking? Authorizing Provider  HYDROcodone-acetaminophen (NORCO/VICODIN) 5-325 MG tablet Take 1 tablet by mouth every 6 (six) hours as needed for severe pain. 04/17/21  Yes Hermon Zea L, PA  lidocaine (XYLOCAINE) 2 % solution Use as directed 15 mLs in the mouth or throat every 6 (six) hours as needed for mouth pain. 04/17/21  Yes Hezakiah Champeau L, PA  ondansetron (ZOFRAN-ODT) 4 MG disintegrating tablet Take 1 tablet (4 mg total) by mouth every 8 (eight) hours as needed for nausea or vomiting. 04/17/21  Yes Naidelyn Parrella L, PA  penicillin v potassium (VEETID) 500 MG tablet Take 1 tablet (500 mg total) by mouth 3 (three) times daily for  10 days. 04/17/21 04/27/21 Yes Adiel Erney L, PA  amLODipine (NORVASC) 5 MG tablet Take 1 tablet (5 mg total) by mouth daily for 14 days. 06/22/20 07/06/20  Glyn Ade, MD    Family History Family History  Problem Relation Age of Onset   Healthy Mother     Social History Social History   Tobacco Use   Smoking status: Every Day    Packs/day: 0.25    Years: 10.00    Pack years: 2.50    Types: Cigarettes   Smokeless tobacco: Never  Substance Use Topics   Alcohol use: No   Drug use: No     Allergies   Patient has no known allergies.   Review of Systems Review of Systems  HENT:  Positive for dental problem.   All other systems reviewed and are negative.   Physical Exam Triage Vital Signs ED Triage Vitals  Enc Vitals Group     BP 04/17/21 1703 (!) 148/122     Pulse Rate 04/17/21 1703 (!) 134     Resp 04/17/21 1703 20     Temp 04/17/21 1703 98.6 F (37 C)     Temp Source 04/17/21 1703 Oral     SpO2 04/17/21 1703 98 %     Weight --      Height --  Head Circumference --      Peak Flow --      Pain Score 04/17/21 1707 10     Pain Loc --      Pain Edu? --      Excl. in GC? --    No data found.  Updated Vital Signs BP (!) 148/122 (BP Location: Right Arm)    Pulse (!) 130    Temp 98.6 F (37 C) (Oral)    Resp 20    SpO2 98%   Visual Acuity Right Eye Distance:   Left Eye Distance:   Bilateral Distance:    Right Eye Near:   Left Eye Near:    Bilateral Near:     Physical Exam Vitals and nursing note reviewed.  Constitutional:      General: He is not in acute distress.    Appearance: Normal appearance. He is well-developed. He is obese. He is not ill-appearing, toxic-appearing or diaphoretic.     Comments: Uncomfortable but non-toxic  HENT:     Head: Normocephalic and atraumatic.     Right Ear: External ear normal.     Left Ear: External ear normal.     Nose: Nose normal. No congestion or rhinorrhea.     Mouth/Throat:     Mouth: Mucous  membranes are moist.     Pharynx: Oropharynx is clear. No oropharyngeal exudate or posterior oropharyngeal erythema.     Comments: Second to posterior molar on bottom R with a crack and decaying.  Gums on both sides of the tooth are severely edematous, painful to palpation, and producing scant purulent drainage upon pressure. FROM of jaw.  NO lymphadenopathy or trismus. No cellulitis of skin Eyes:     Conjunctiva/sclera: Conjunctivae normal.  Cardiovascular:     Rate and Rhythm: Regular rhythm. Tachycardia present.     Heart sounds: No murmur heard. Pulmonary:     Effort: Pulmonary effort is normal. No respiratory distress.     Breath sounds: Normal breath sounds.  Abdominal:     Palpations: Abdomen is soft.     Tenderness: There is no abdominal tenderness.  Musculoskeletal:        General: No swelling.     Cervical back: Neck supple.  Skin:    General: Skin is warm and dry.     Capillary Refill: Capillary refill takes less than 2 seconds.  Neurological:     Mental Status: He is alert.  Psychiatric:        Mood and Affect: Mood normal.     UC Treatments / Results  Labs (all labs ordered are listed, but only abnormal results are displayed) Labs Reviewed - No data to display  EKG   Radiology No results found.  Procedures Procedures (including critical care time)  Medications Ordered in UC Medications  ketorolac (TORADOL) 30 MG/ML injection 30 mg (30 mg Intramuscular Given 04/17/21 1745)  ondansetron (ZOFRAN-ODT) disintegrating tablet 4 mg (4 mg Oral Given 04/17/21 1753)    Initial Impression / Assessment and Plan / UC Course  I have reviewed the triage vital signs and the nursing notes.  Pertinent labs & imaging results that were available during my care of the patient were reviewed by me and considered in my medical decision making (see chart for details).     Dental abscess - start antibiotics as prescribed. Tooth will need to be extracted given its current  state of decay Dental pain - topical lidocaine prior to meals. Toradol given in office today. Norco PRN,  ibuprofen 600mg  QID until seen by dentist. Tachycardia - likely secondary to mild dehydration from n/v in conjunction with acute pain Nausea - zofran given in office. Will send home with Rx as well.  Final Clinical Impressions(s) / UC Diagnoses   Final diagnoses:  Dental abscess  Pain, dental  Nausea     Discharge Instructions      Your source of pain is coming from an abscess around your back right molar. Please take the antibiotics as prescribed You can use the viscous lidocaine to apply around your tooth with a Q-tip prior to eating.  The pain relief will only be momentary. Use the pain medication prescribed on an as-needed basis.  Side effects may be drowsiness or constipation.  Take only for moderate to severe pain. For mild pain, consider trying 600 mg of ibuprofen every 6 hours as needed. Please call a dentist ASAP to schedule a follow-up.  You were given zofran in office, you cannot take another tablet until around 2am if needed.    ED Prescriptions     Medication Sig Dispense Auth. Provider   penicillin v potassium (VEETID) 500 MG tablet Take 1 tablet (500 mg total) by mouth 3 (three) times daily for 10 days. 30 tablet Alycen Mack L, PA   lidocaine (XYLOCAINE) 2 % solution Use as directed 15 mLs in the mouth or throat every 6 (six) hours as needed for mouth pain. 20 mL Noriel Guthrie L, PA   HYDROcodone-acetaminophen (NORCO/VICODIN) 5-325 MG tablet Take 1 tablet by mouth every 6 (six) hours as needed for severe pain. 20 tablet Ruvi Fullenwider L, PA   ondansetron (ZOFRAN-ODT) 4 MG disintegrating tablet Take 1 tablet (4 mg total) by mouth every 8 (eight) hours as needed for nausea or vomiting. 20 tablet Elyon Zoll L, [place]      I have reviewed the PDMP during this encounter.   Maretta Bees, [place] 04/17/21 [phone]

## 2021-04-17 NOTE — ED Triage Notes (Signed)
Patient c/o RT lower dental pain x 3 days.   Patient endorses pain has worsened.   Patient has attempted to set dental appointment.   Patient has taken Excedrin with no relief of symptoms.   History of similar problem with dental abscess.

## 2021-04-17 NOTE — Discharge Instructions (Addendum)
Your source of pain is coming from an abscess around your back right molar. Please take the antibiotics as prescribed You can use the viscous lidocaine to apply around your tooth with a Q-tip prior to eating.  The pain relief will only be momentary. Use the pain medication prescribed on an as-needed basis.  Side effects may be drowsiness or constipation.  Take only for moderate to severe pain. For mild pain, consider trying 600 mg of ibuprofen every 6 hours as needed. Please call a dentist ASAP to schedule a follow-up.  You were given zofran in office, you cannot take another tablet until around 2am if needed.

## 2021-05-10 ENCOUNTER — Other Ambulatory Visit: Payer: Self-pay

## 2021-05-10 ENCOUNTER — Other Ambulatory Visit (HOSPITAL_COMMUNITY): Payer: Self-pay

## 2021-05-10 ENCOUNTER — Encounter (HOSPITAL_COMMUNITY): Payer: Self-pay | Admitting: *Deleted

## 2021-05-10 ENCOUNTER — Ambulatory Visit (HOSPITAL_COMMUNITY)
Admission: EM | Admit: 2021-05-10 | Discharge: 2021-05-10 | Disposition: A | Discharge status: Home / Self Care | Payer: 59 | Attending: Emergency Medicine | Admitting: Emergency Medicine

## 2021-05-10 DIAGNOSIS — Z20822 Contact with and (suspected) exposure to covid-19: Secondary | ICD-10-CM | POA: Insufficient documentation

## 2021-05-10 DIAGNOSIS — J069 Acute upper respiratory infection, unspecified: Secondary | ICD-10-CM | POA: Insufficient documentation

## 2021-05-10 DIAGNOSIS — H9201 Otalgia, right ear: Secondary | ICD-10-CM | POA: Diagnosis not present

## 2021-05-10 DIAGNOSIS — R059 Cough, unspecified: Secondary | ICD-10-CM | POA: Diagnosis not present

## 2021-05-10 DIAGNOSIS — J029 Acute pharyngitis, unspecified: Secondary | ICD-10-CM | POA: Insufficient documentation

## 2021-05-10 DIAGNOSIS — R0981 Nasal congestion: Secondary | ICD-10-CM | POA: Insufficient documentation

## 2021-05-10 LAB — POC INFLUENZA A AND B ANTIGEN (URGENT CARE ONLY)
INFLUENZA A ANTIGEN, POC: NEGATIVE
INFLUENZA B ANTIGEN, POC: NEGATIVE

## 2021-05-10 LAB — SARS CORONAVIRUS 2 (TAT 6-24 HRS): SARS Coronavirus 2: NEGATIVE

## 2021-05-10 LAB — POCT RAPID STREP A, ED / UC: Streptococcus, Group A Screen (Direct): NEGATIVE

## 2021-05-10 MED ORDER — GUAIFENESIN 100 MG/5ML PO LIQD
100.0000 mg | ORAL | 0 refills | Status: DC | PRN
  Filled 2021-05-10: qty 60, 1d supply, fill #0

## 2021-05-10 MED ORDER — METHYLPREDNISOLONE SODIUM SUCC 125 MG IJ SOLR
INTRAMUSCULAR | Status: AC
  Filled 2021-05-10: qty 2

## 2021-05-10 MED ORDER — PREDNISONE 10 MG PO TABS
20.0000 mg | ORAL_TABLET | Freq: Every day | ORAL | 0 refills | Status: DC
  Filled 2021-05-10: qty 15, 7d supply, fill #0

## 2021-05-10 MED ORDER — FLUTICASONE PROPIONATE 50 MCG/ACT NA SUSP
2.0000 | Freq: Every day | NASAL | 2 refills | Status: DC
  Filled 2021-05-10: qty 16, 30d supply, fill #0

## 2021-05-10 MED ORDER — KETOROLAC TROMETHAMINE 60 MG/2ML IM SOLN
60.0000 mg | Freq: Once | INTRAMUSCULAR | Status: AC
  Administered 2021-05-10: 60 mg via INTRAMUSCULAR

## 2021-05-10 MED ORDER — KETOROLAC TROMETHAMINE 60 MG/2ML IM SOLN
INTRAMUSCULAR | Status: AC
Start: 2021-05-10 — End: ?
  Filled 2021-05-10: qty 2

## 2021-05-10 MED ORDER — METHYLPREDNISOLONE SODIUM SUCC 125 MG IJ SOLR
125.0000 mg | Freq: Once | INTRAMUSCULAR | Status: AC
  Administered 2021-05-10: 125 mg via INTRAMUSCULAR

## 2021-05-10 NOTE — ED Triage Notes (Signed)
Pt reports nasal congestion,cough and sore throat that was worse last night. Pt is sleeping during triage. Pt reports he can not sleep at home. Pt also thinks he has pink eye on RT. ?

## 2021-05-10 NOTE — Discharge Instructions (Addendum)
We will send off your COVID results check your MyChart in the next 24 hours ?Use nasal spray as needed to treat the congestion ?Take Tylenol or Motrin as needed for pain or fever ?Use a humidifier at night to help with breathing you can also use cough drops to help with soothing the throat ?Your flu and strep are negative ?
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
## 2021-05-14 LAB — CULTURE, GROUP A STREP (THRC)

## 2021-05-25 ENCOUNTER — Encounter (HOSPITAL_COMMUNITY): Payer: Self-pay | Admitting: Emergency Medicine

## 2021-05-25 ENCOUNTER — Ambulatory Visit (HOSPITAL_COMMUNITY)
Admission: EM | Admit: 2021-05-25 | Discharge: 2021-05-25 | Disposition: A | Discharge status: Home / Self Care | Payer: 59 | Attending: Family Medicine | Admitting: Family Medicine

## 2021-05-25 DIAGNOSIS — R369 Urethral discharge, unspecified: Secondary | ICD-10-CM | POA: Insufficient documentation

## 2021-05-25 LAB — POCT URINALYSIS DIPSTICK, ED / UC
Bilirubin Urine: NEGATIVE
Glucose, UA: NEGATIVE mg/dL
Ketones, ur: NEGATIVE mg/dL
Nitrite: NEGATIVE
Protein, ur: 30 mg/dL — AB
Specific Gravity, Urine: 1.025 (ref 1.005–1.030)
Urobilinogen, UA: 0.2 mg/dL (ref 0.0–1.0)
pH: 7 (ref 5.0–8.0)

## 2021-05-25 LAB — RPR: RPR Ser Ql: NONREACTIVE

## 2021-05-25 LAB — HIV ANTIBODY (ROUTINE TESTING W REFLEX): HIV Screen 4th Generation wRfx: NONREACTIVE

## 2021-05-25 MED ORDER — LIDOCAINE HCL (PF) 1 % IJ SOLN
INTRAMUSCULAR | Status: AC
  Filled 2021-05-25: qty 2

## 2021-05-25 MED ORDER — CEFTRIAXONE SODIUM 500 MG IJ SOLR
INTRAMUSCULAR | Status: AC
  Filled 2021-05-25: qty 500

## 2021-05-25 MED ORDER — CEFTRIAXONE SODIUM 500 MG IJ SOLR
500.0000 mg | Freq: Once | INTRAMUSCULAR | Status: AC
  Administered 2021-05-25: 500 mg via INTRAMUSCULAR

## 2021-05-25 NOTE — ED Triage Notes (Signed)
Pt c/o little burning with urination and penile discharge for a few days.  ?
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
## 2021-05-25 NOTE — Discharge Instructions (Addendum)
You were seen today for penile discharge.  ?The most likely cause is likely a STD.  I have treated you for gonorrhea today with an injection of an antibiotic.  The rest of the tests will be resulted tomorrow and if further treatment is needed we will call you and sent out treatment.  ?Please avoid sexually activity until you know the results of these tests.  ?Lab work will also be resulted tomorrow.  These will all be visible in Mychart.  ? ?

## 2021-05-26 LAB — CYTOLOGY, (ORAL, ANAL, URETHRAL) ANCILLARY ONLY
Chlamydia: NEGATIVE
Comment: NEGATIVE
Comment: NEGATIVE
Comment: NORMAL
Neisseria Gonorrhea: POSITIVE — AB
Trichomonas: NEGATIVE

## 2021-05-26 LAB — URINE CULTURE: Culture: NO GROWTH

## 2021-06-26 ENCOUNTER — Emergency Department (HOSPITAL_COMMUNITY): Payer: 59

## 2021-06-26 ENCOUNTER — Emergency Department (HOSPITAL_COMMUNITY)
Admission: EM | Admit: 2021-06-26 | Discharge: 2021-06-27 | Disposition: A | Discharge status: Home / Self Care | Payer: 59 | Attending: Emergency Medicine | Admitting: Emergency Medicine

## 2021-06-26 ENCOUNTER — Encounter (HOSPITAL_COMMUNITY): Payer: Self-pay | Admitting: Emergency Medicine

## 2021-06-26 ENCOUNTER — Other Ambulatory Visit: Payer: Self-pay

## 2021-06-26 DIAGNOSIS — S20419A Abrasion of unspecified back wall of thorax, initial encounter: Secondary | ICD-10-CM | POA: Diagnosis not present

## 2021-06-26 DIAGNOSIS — S71132A Puncture wound without foreign body, left thigh, initial encounter: Secondary | ICD-10-CM

## 2021-06-26 DIAGNOSIS — S79922A Unspecified injury of left thigh, initial encounter: Secondary | ICD-10-CM | POA: Diagnosis present

## 2021-06-26 DIAGNOSIS — S40212A Abrasion of left shoulder, initial encounter: Secondary | ICD-10-CM | POA: Insufficient documentation

## 2021-06-26 DIAGNOSIS — W3400XA Accidental discharge from unspecified firearms or gun, initial encounter: Secondary | ICD-10-CM | POA: Diagnosis not present

## 2021-06-26 DIAGNOSIS — S71102A Unspecified open wound, left thigh, initial encounter: Secondary | ICD-10-CM | POA: Insufficient documentation

## 2021-06-26 MED ORDER — LACTATED RINGERS IV BOLUS
1000.0000 mL | Freq: Once | INTRAVENOUS | Status: AC
  Administered 2021-06-26: 1000 mL via INTRAVENOUS
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
## 2021-06-27 NOTE — ED Notes (Signed)
Pt's cell phone removed by [employer]. Pt aware ED staff don't have his cell phone. ?
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
## 2021-07-06 ENCOUNTER — Encounter (HOSPITAL_COMMUNITY): Payer: Self-pay

## 2021-07-06 ENCOUNTER — Emergency Department (HOSPITAL_COMMUNITY)
Admission: EM | Admit: 2021-07-06 | Discharge: 2021-07-07 | Disposition: A | Discharge status: Home / Self Care | Payer: 59 | Attending: Emergency Medicine | Admitting: Emergency Medicine

## 2021-07-06 ENCOUNTER — Other Ambulatory Visit: Payer: Self-pay

## 2021-07-06 ENCOUNTER — Emergency Department (HOSPITAL_COMMUNITY): Payer: 59

## 2021-07-06 DIAGNOSIS — M79662 Pain in left lower leg: Secondary | ICD-10-CM | POA: Diagnosis not present

## 2021-07-06 DIAGNOSIS — M7989 Other specified soft tissue disorders: Secondary | ICD-10-CM | POA: Insufficient documentation

## 2021-07-06 DIAGNOSIS — Z5321 Procedure and treatment not carried out due to patient leaving prior to being seen by health care provider: Secondary | ICD-10-CM | POA: Insufficient documentation
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
## 2021-07-07 ENCOUNTER — Encounter (HOSPITAL_COMMUNITY): Payer: Self-pay

## 2021-07-07 ENCOUNTER — Other Ambulatory Visit (HOSPITAL_COMMUNITY): Payer: Self-pay

## 2021-07-07 ENCOUNTER — Ambulatory Visit (HOSPITAL_COMMUNITY)
Admission: EM | Admit: 2021-07-07 | Discharge: 2021-07-07 | Disposition: A | Discharge status: Home / Self Care | Payer: 59 | Attending: Internal Medicine | Admitting: Internal Medicine

## 2021-07-07 DIAGNOSIS — S81832D Puncture wound without foreign body, left lower leg, subsequent encounter: Secondary | ICD-10-CM

## 2021-07-07 DIAGNOSIS — M79605 Pain in left leg: Secondary | ICD-10-CM

## 2021-07-07 MED ORDER — TRAMADOL HCL 50 MG PO TABS
50.0000 mg | ORAL_TABLET | Freq: Four times a day (QID) | ORAL | 0 refills | Status: DC | PRN
  Filled 2021-07-07: qty 10, 3d supply, fill #0
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
## 2021-07-07 NOTE — ED Notes (Signed)
Pt states he is leaving while handing this tech his labels and walking out the door ?

## 2021-07-07 NOTE — Discharge Instructions (Signed)
Please go to the emergency department as soon as you leave urgent care for further evaluation and management as you need more advanced imaging.  You have been prescribed tramadol to help alleviate your pain but please take sparingly.  Also be advised that tramadol can cause drowsiness so do not drive while taking this medication. ?

## 2021-07-15 ENCOUNTER — Other Ambulatory Visit (HOSPITAL_COMMUNITY): Payer: Self-pay

## 2022-05-23 ENCOUNTER — Ambulatory Visit (HOSPITAL_COMMUNITY)
Admission: EM | Admit: 2022-05-23 | Discharge: 2022-05-23 | Disposition: A | Discharge status: Home / Self Care | Payer: 59 | Attending: Family Medicine | Admitting: Family Medicine

## 2022-05-23 ENCOUNTER — Encounter (HOSPITAL_COMMUNITY): Payer: Self-pay

## 2022-05-23 DIAGNOSIS — Z1152 Encounter for screening for COVID-19: Secondary | ICD-10-CM

## 2022-05-23 DIAGNOSIS — R051 Acute cough: Secondary | ICD-10-CM | POA: Insufficient documentation

## 2022-05-23 NOTE — ED Triage Notes (Signed)
Pt requesting COVID testing. States has had COVID multiple times with a distinguish taste in his mouth and developed the same taste yesterday. States has had a slight cough and congestion.

## 2022-05-23 NOTE — ED Provider Notes (Signed)
[facility]    CSN: [id number] Arrival date & time: 05/23/22  1155      History   Chief Complaint Chief Complaint  Patient presents with   Cough    HPI Angel [name] is a 32 y.o. male.   Patient is here for covid test.  He has had covid about 8 times.  He has a bad taste in his mouth that he normally gets with this.  He started with that taste yesterday.  Mild cough, headache.  No runny nose, congestion.  No sore throat.  No n/v.  He last tested positive for covid last year.        History reviewed. No pertinent past medical history.  There are no problems to display for this patient.   Past Surgical History:  Procedure Laterality Date   I & D EXTREMITY  08/02/2011   Procedure: IRRIGATION AND DEBRIDEMENT EXTREMITY;  Surgeon: Roseanne Kaufman, MD;  Location: [facility];  Service: Orthopedics;  Laterality: Left;   TONSILLECTOMY         Home Medications    Prior to Admission medications   Not on File    Family History Family History  Problem Relation Age of Onset   Healthy Mother     Social History Social History   Tobacco Use   Smoking status: Every Day    Packs/day: 0.25    Years: 10.00    Additional pack years: 0.00    Total pack years: 2.50    Types: Cigarettes   Smokeless tobacco: Never  Substance Use Topics   Alcohol use: No   Drug use: No     Allergies   Patient has no known allergies.   Review of Systems Review of Systems  Constitutional: Negative.   HENT: Negative.    Respiratory:  Positive for cough.   Cardiovascular: Negative.   Gastrointestinal: Negative.   Musculoskeletal: Negative.   Psychiatric/Behavioral: Negative.       Physical Exam Triage Vital Signs ED Triage Vitals  Enc Vitals Group     BP 05/23/22 1317 (!) 157/88     Pulse Rate 05/23/22 1317 (!) 109     Resp 05/23/22 1317 18     Temp 05/23/22 1317 98.3 F (36.8 C)     Temp Source 05/23/22 1317 Oral     SpO2 05/23/22 1317 98 %     Weight --       Height --      Head Circumference --      Peak Flow --      Pain Score 05/23/22 1318 0     Pain Loc --      Pain Edu? --      Excl. in [place]? --    No data found.  Updated Vital Signs BP (!) 157/88 (BP Location: Left Arm)   Pulse (!) 109   Temp 98.3 F (36.8 C) (Oral)   Resp 18   SpO2 98%   Visual Acuity Right Eye Distance:   Left Eye Distance:   Bilateral Distance:    Right Eye Near:   Left Eye Near:    Bilateral Near:     Physical Exam Constitutional:      Appearance: Normal appearance.  HENT:     Mouth/Throat:     Mouth: Mucous membranes are moist.  Cardiovascular:     Rate and Rhythm: Normal rate.  Pulmonary:     Effort: Pulmonary effort is normal.  Musculoskeletal:     Cervical back: Neck  supple. No tenderness.  Lymphadenopathy:     Cervical: No cervical adenopathy.  Neurological:     General: No focal deficit present.     Mental Status: He is alert.  Psychiatric:        Mood and Affect: Mood normal.      UC Treatments / Results  Labs (all labs ordered are listed, but only abnormal results are displayed) Labs Reviewed  SARS CORONAVIRUS 2 (TAT 6-24 HRS)    EKG   Radiology No results found.  Procedures Procedures (including critical care time)  Medications Ordered in UC Medications - No data to display  Initial Impression / Assessment and Plan / UC Course  I have reviewed the triage vital signs and the nursing notes.  Pertinent labs & imaging results that were available during my care of the patient were reviewed by me and considered in my medical decision making (see chart for details).    Final Clinical Impressions(s) / UC Diagnoses   Final diagnoses:  Acute cough  Encounter for screening for COVID-19     Discharge Instructions      You were seen today for covid screening.  This will be resulted tomorrow and you will be notified if positive.  Please stay home from work if you have fevers and feeling poorly.      ED  Prescriptions   None    PDMP not reviewed this encounter.   Rondel Oh, MD 05/23/22 1350

## 2022-05-23 NOTE — Discharge Instructions (Signed)
You were seen today for covid screening.  This will be resulted tomorrow and you will be notified if positive.  Please stay home from work if you have fevers and feeling poorly.

## 2022-05-24 LAB — SARS CORONAVIRUS 2 (TAT 6-24 HRS): SARS Coronavirus 2: NEGATIVE

## 2022-05-26 ENCOUNTER — Emergency Department (HOSPITAL_COMMUNITY)
Admission: EM | Admit: 2022-05-26 | Discharge: 2022-05-27 | Disposition: A | Discharge status: Home / Self Care | Payer: Self-pay | Attending: Emergency Medicine | Admitting: Emergency Medicine

## 2022-05-26 ENCOUNTER — Other Ambulatory Visit: Payer: Self-pay

## 2022-05-26 DIAGNOSIS — S71119A Laceration without foreign body, unspecified thigh, initial encounter: Secondary | ICD-10-CM

## 2022-05-26 DIAGNOSIS — Z23 Encounter for immunization: Secondary | ICD-10-CM | POA: Insufficient documentation

## 2022-05-26 DIAGNOSIS — S71152A Open bite, left thigh, initial encounter: Secondary | ICD-10-CM | POA: Insufficient documentation

## 2022-05-26 DIAGNOSIS — S71151A Open bite, right thigh, initial encounter: Secondary | ICD-10-CM | POA: Insufficient documentation

## 2022-05-26 DIAGNOSIS — W540XXA Bitten by dog, initial encounter: Secondary | ICD-10-CM | POA: Insufficient documentation

## 2022-05-26 MED ORDER — ACETAMINOPHEN 500 MG PO TABS
1000.0000 mg | ORAL_TABLET | Freq: Once | ORAL | Status: AC
  Administered 2022-05-26: 1000 mg via ORAL
  Filled 2022-05-26: qty 2

## 2022-05-26 MED ORDER — LIDOCAINE-EPINEPHRINE (PF) 2 %-1:200000 IJ SOLN
10.0000 mL | Freq: Once | INTRAMUSCULAR | Status: AC
  Administered 2022-05-26: 10 mL via INTRADERMAL
  Filled 2022-05-26: qty 20

## 2022-05-26 MED ORDER — MORPHINE SULFATE 15 MG PO TABS: 7.5000 mg | ORAL_TABLET | ORAL | 0 refills | Status: AC | PRN

## 2022-05-26 MED ORDER — AMOXICILLIN-POT CLAVULANATE 875-125 MG PO TABS: 1.0000 | ORAL_TABLET | Freq: Two times a day (BID) | ORAL | 0 refills | Status: AC

## 2022-05-26 MED ORDER — TETANUS-DIPHTH-ACELL PERTUSSIS 5-2.5-18.5 LF-MCG/0.5 IM SUSY
0.5000 mL | PREFILLED_SYRINGE | Freq: Once | INTRAMUSCULAR | Status: AC
  Administered 2022-05-26: 0.5 mL via INTRAMUSCULAR
  Filled 2022-05-26: qty 0.5

## 2022-05-26 MED ORDER — OXYCODONE HCL 5 MG PO TABS
5.0000 mg | ORAL_TABLET | Freq: Once | ORAL | Status: AC
  Administered 2022-05-26: 5 mg via ORAL
  Filled 2022-05-26: qty 1

## 2022-05-26 NOTE — ED Triage Notes (Signed)
Patient presents with multiple deep lacerations at both legs sustained from a pitt bull bite this evening , unknown immunization status of dog.

## 2022-05-26 NOTE — Discharge Instructions (Addendum)
Return for redness drainage or if you get a fever.  Typically stitches put in the legs are removed sometime between day 10 and 14.  This can be done here at urgent care or at your family doctor's office.  The area can get wet but not fully immersed underwater.  No scrubbing.  If you really want to clean it you can apply a half-and-half hydrogen peroxide solution with water on a Q-tip.  You can apply an ointment a couple times a day this could be as simple as Vaseline but could also be an antibiotic ointment if you wish.   Take 4 over the counter ibuprofen tablets 3 times a day or 2 over-the-counter naproxen tablets twice a day for pain. Also take tylenol 1000mg (2 extra strength) four times a day.   Then take the pain medicine if you feel like you need it. Narcotics do not help with the pain, they only make you care about it less.  You can become addicted to this, people may break into your house to steal it.  It will constipate you.  If you drive under the influence of this medicine you can get a DUI.

## 2022-05-26 NOTE — ED Provider Notes (Signed)
[facility] Provider Note   CSN: [id number] Arrival date & time: 05/26/22  2112     History  Chief Complaint  Patient presents with   Dog Bite     Leg Lacerations     Angel [name] is a 32 y.o. male.  32 yo M with a chief complaints of being bit by dog.  Tells me that he was bit by a pit bull on both legs.  He does not think that the animal shots are up-to-date.  He did not call animal control because it is friends animal.  He is not sure of his own tetanus.        Home Medications Prior to Admission medications   Medication Sig Start Date End Date Taking? Authorizing Provider  amoxicillin-clavulanate (AUGMENTIN) 875-125 MG tablet Take 1 tablet by mouth every 12 (twelve) hours. 05/26/22  Yes Deno Etienne, DO  morphine (MSIR) 15 MG tablet Take 0.5 tablets (7.5 mg total) by mouth every 4 (four) hours as needed for severe pain. 05/26/22  Yes Deno Etienne, DO      Allergies    Patient has no known allergies.    Review of Systems   Review of Systems  Physical Exam Updated Vital Signs BP 137/71   Pulse (!) 145   Temp 97.7 F (36.5 C)   Resp 18   SpO2 97%  Physical Exam Vitals and nursing note reviewed.  Constitutional:      Appearance: He is well-developed.  HENT:     Head: Normocephalic and atraumatic.  Eyes:     Pupils: Pupils are equal, round, and reactive to light.  Neck:     Vascular: No JVD.  Cardiovascular:     Rate and Rhythm: Normal rate and regular rhythm.     Heart sounds: No murmur heard.    No friction rub. No gallop.  Pulmonary:     Effort: No respiratory distress.     Breath sounds: No wheezing.  Abdominal:     General: There is no distension.     Tenderness: There is no abdominal tenderness. There is no guarding or rebound.  Musculoskeletal:        General: Normal range of motion.     Cervical back: Normal range of motion and neck supple.     Comments: gaping wounds to bilateral inner thighs.   Skin:    Coloration: Skin is not pale.     Findings: No rash.  Neurological:     Mental Status: He is alert and oriented to person, place, and time.  Psychiatric:        Behavior: Behavior normal.     ED Results / Procedures / Treatments   Labs (all labs ordered are listed, but only abnormal results are displayed) Labs Reviewed - No data to display  EKG None  Radiology No results found.  Procedures .Marland KitchenLaceration Repair  Date/Time: 05/26/2022 11:49 PM  Performed by: Deno Etienne, DO Authorized by: Deno Etienne, DO   Consent:    Consent obtained:  Verbal   Consent given by:  Patient   Risks, benefits, and alternatives were discussed: yes     Risks discussed:  Infection, pain, poor cosmetic result and poor wound healing   Alternatives discussed:  No treatment Universal protocol:    Procedure explained and questions answered to patient or proxy's satisfaction: yes     Immediately prior to procedure, a time out was called: yes     Patient identity confirmed:  Verbally with patient Laceration details:    Location:  Leg   Leg location:  R upper leg   Length (cm):  8 Pre-procedure details:    Preparation:  Patient was prepped and draped in usual sterile fashion Exploration:    Hemostasis achieved with:  Epinephrine and direct pressure Treatment:    Area cleansed with:  Chlorhexidine   Amount of cleaning:  Standard   Irrigation solution:  Sterile saline   Irrigation volume:  50   Irrigation method:  Pressure wash   Debridement:  None   Undermining:  None   Scar revision: no   Skin repair:    Repair method:  Sutures   Suture size:  3-0   Suture material:  Nylon   Suture technique:  Simple interrupted   Number of sutures:  1 Approximation:    Approximation:  Loose Repair type:    Repair type:  Simple Post-procedure details:    Dressing:  Antibiotic ointment and adhesive bandage   Procedure completion:  Tolerated well, no immediate complications .Marland KitchenLaceration  Repair  Date/Time: 05/26/2022 11:55 PM  Performed by: Deno Etienne, DO Authorized by: Deno Etienne, DO   Consent:    Consent obtained:  Verbal   Consent given by:  Patient   Risks, benefits, and alternatives were discussed: yes     Risks discussed:  Infection, pain, poor cosmetic result and poor wound healing   Alternatives discussed:  No treatment Universal protocol:    Procedure explained and questions answered to patient or proxy's satisfaction: yes     Immediately prior to procedure, a time out was called: yes     Patient identity confirmed:  Verbally with patient Laceration details:    Location:  Leg   Leg location:  L upper leg   Length (cm):  5.2 Pre-procedure details:    Preparation:  Patient was prepped and draped in usual sterile fashion Exploration:    Hemostasis achieved with:  Epinephrine and direct pressure Treatment:    Area cleansed with:  Chlorhexidine   Amount of cleaning:  Standard   Irrigation solution:  Sterile saline   Irrigation volume:  50   Irrigation method:  Pressure wash   Debridement:  None   Undermining:  None   Scar revision: no   Skin repair:    Repair method:  Sutures   Suture size:  3-0   Suture material:  Nylon   Suture technique:  Simple interrupted   Number of sutures:  1 Approximation:    Approximation:  Close Repair type:    Repair type:  Simple Post-procedure details:    Dressing:  Antibiotic ointment and adhesive bandage   Procedure completion:  Tolerated well, no immediate complications     Medications Ordered in ED Medications  acetaminophen (TYLENOL) tablet 1,000 mg (1,000 mg Oral Given 05/26/22 2346)  oxyCODONE (Oxy IR/ROXICODONE) immediate release tablet 5 mg (5 mg Oral Given 05/26/22 2346)  lidocaine-EPINEPHrine (XYLOCAINE W/EPI) 2 %-1:200000 (PF) injection 10 mL (10 mLs Intradermal Given 05/26/22 2348)  Tdap (BOOSTRIX) injection 0.5 mL (0.5 mLs Intramuscular Given 05/26/22 2346)    ED Course/ Medical Decision Making/  A&P                             Medical Decision Making Risk OTC drugs. Prescription drug management.   32 yo M with a chief complaints of being bit by a pit bull to bilateral thighs.  The wounds are quite gaping  and so I will irrigate and loosely closed.  Will start on Augmentin.  PCP follow-up.  I did encourage him to report the animal to animal control.  I encouraged him if the animal died to make sure that it was tested for rabies.  11:56 PM:  I have discussed the diagnosis/risks/treatment options with the patient.  Evaluation and diagnostic testing in the emergency department does not suggest an emergent condition requiring admission or immediate intervention beyond what has been performed at this time.  They will follow up with PCP. We also discussed returning to the ED immediately if new or worsening sx occur. We discussed the sx which are most concerning (e.g., sudden worsening pain, fever, inability to tolerate by mouth) that necessitate immediate return. Medications administered to the patient during their visit and any new prescriptions provided to the patient are listed below.  Medications given during this visit Medications  acetaminophen (TYLENOL) tablet 1,000 mg (1,000 mg Oral Given 05/26/22 2346)  oxyCODONE (Oxy IR/ROXICODONE) immediate release tablet 5 mg (5 mg Oral Given 05/26/22 2346)  lidocaine-EPINEPHrine (XYLOCAINE W/EPI) 2 %-1:200000 (PF) injection 10 mL (10 mLs Intradermal Given 05/26/22 2348)  Tdap (BOOSTRIX) injection 0.5 mL (0.5 mLs Intramuscular Given 05/26/22 2346)     The patient appears reasonably screen and/or stabilized for discharge and I doubt any other medical condition or other [facility] requiring further screening, evaluation, or treatment in the ED at this time prior to discharge.          Final Clinical Impression(s) / ED Diagnoses Final diagnoses:  Dog bite, initial encounter  Laceration of thigh, unspecified laterality, initial encounter    Rx /  DC Orders ED Discharge Orders          Ordered    amoxicillin-clavulanate (AUGMENTIN) 875-125 MG tablet  Every 12 hours        05/26/22 2343    morphine (MSIR) 15 MG tablet  Every 4 hours PRN        05/26/22 2343              Deno Etienne, DO 05/26/22 2356

## 2022-05-27 NOTE — ED Notes (Signed)
Bilateral leg and rt arm wounds cleaned and dressed

## 2023-03-28 IMAGING — CR DG CHEST 2V
2 series · 2 of 2 positions shown · non-contrast
Comparison: 08/15/2015

CLINICAL DATA: Chest pain

EXAM:
CHEST - 2 VIEW

[chest pa]
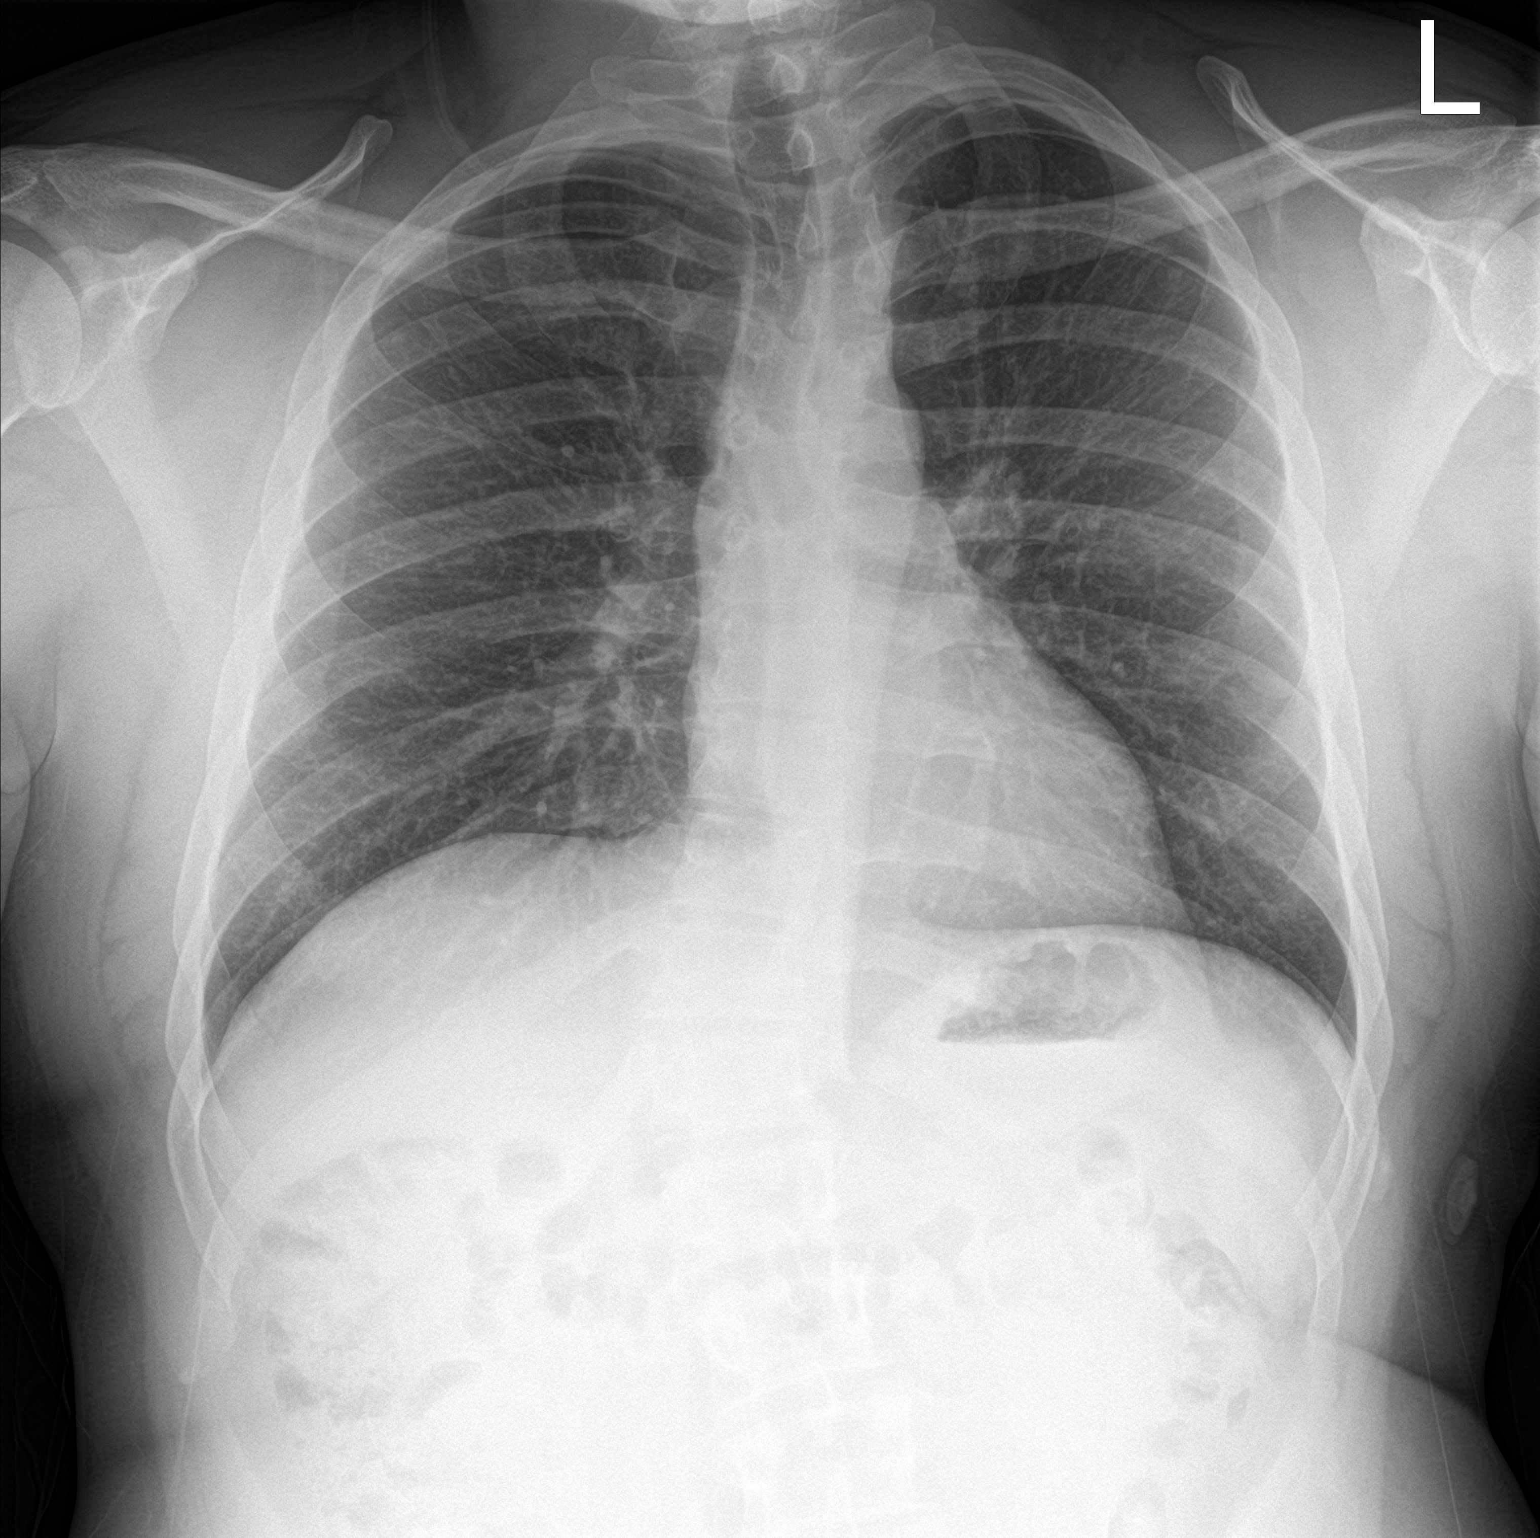

[chest lat]
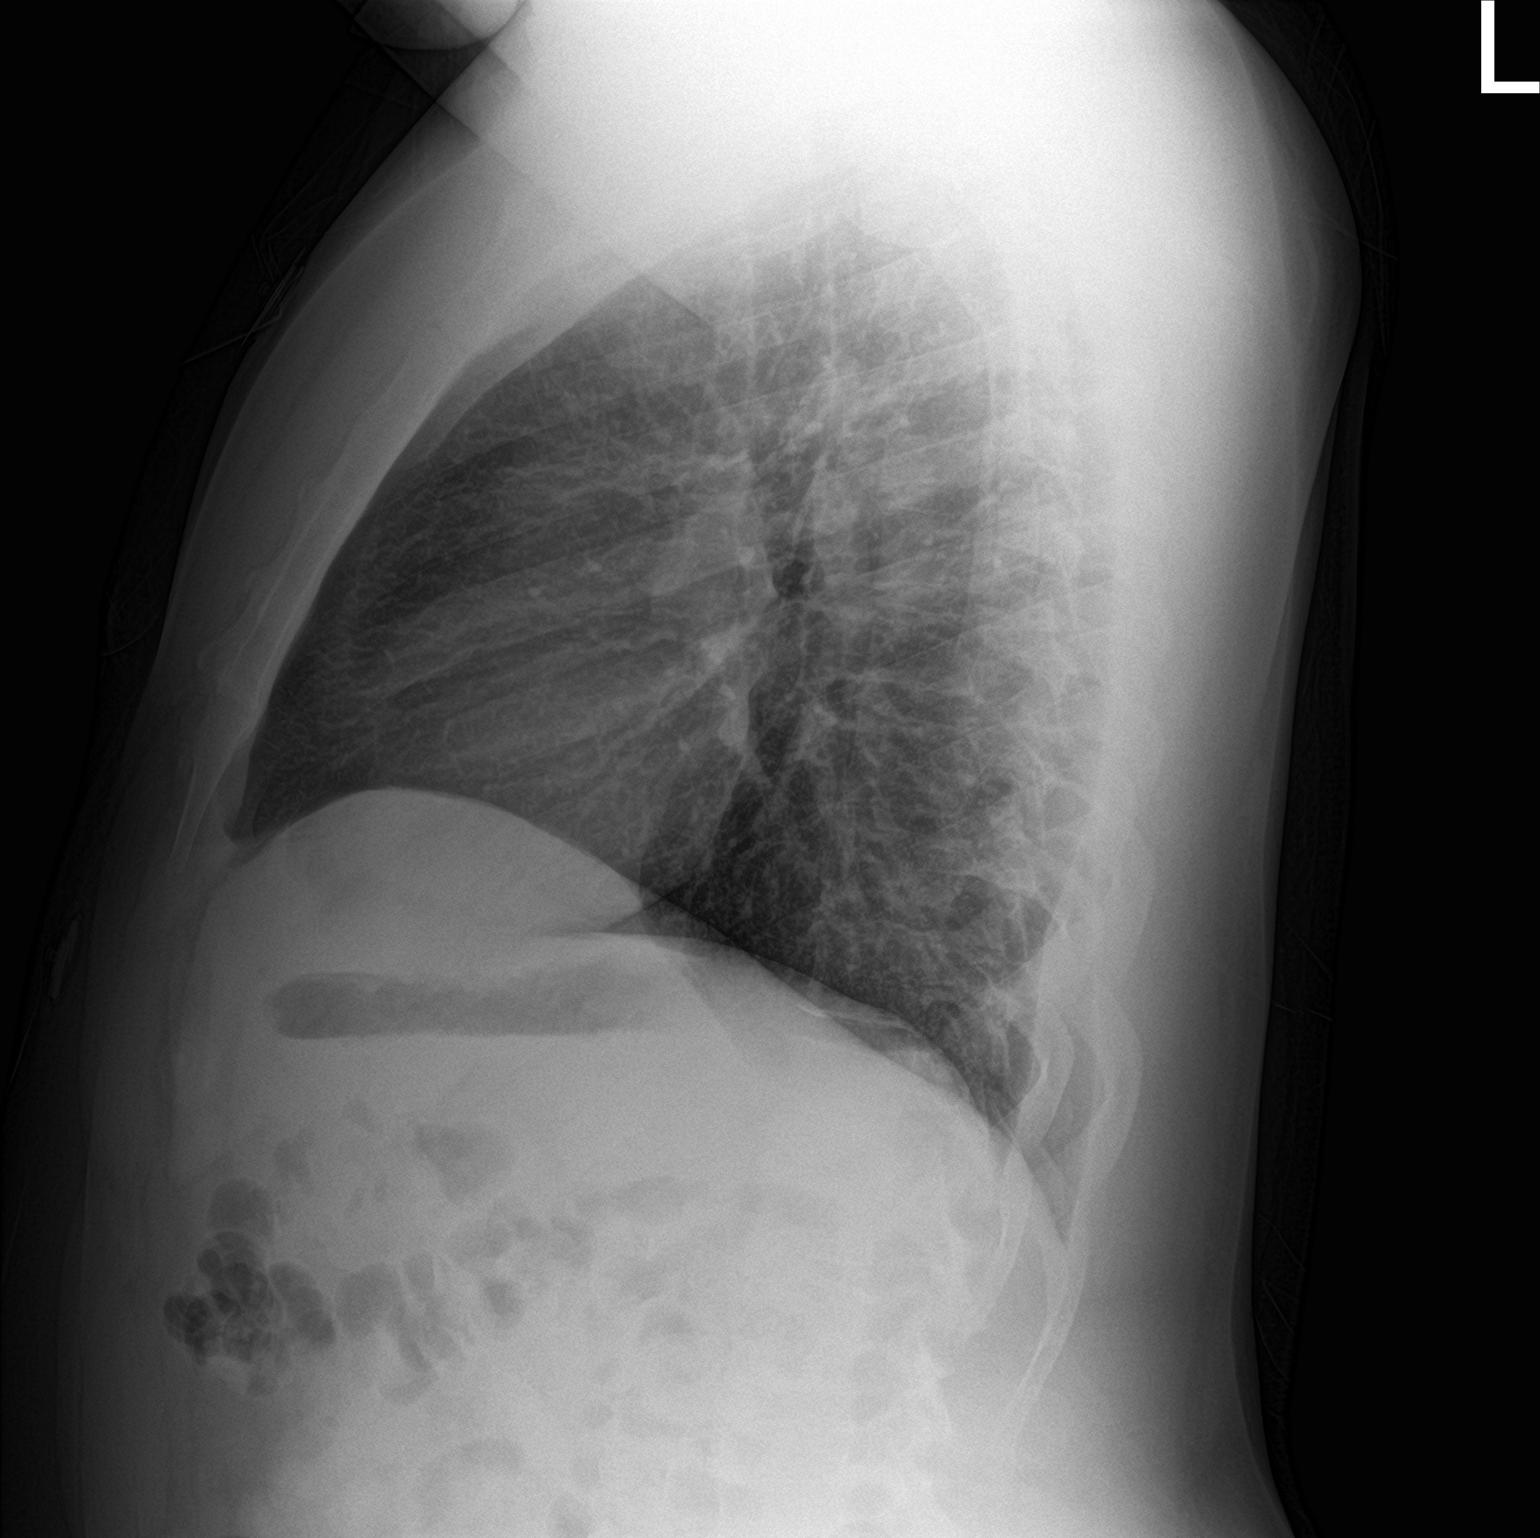

[2 of 2 positions shown; findings below may reference images not displayed]

FINDINGS: The heart size and mediastinal contours are within normal limits.
Both lungs are clear. The visualized skeletal structures are
unremarkable.
IMPRESSION: No active cardiopulmonary disease.
